# Patient Record
Sex: Male | Born: 1945 | Race: White | Hispanic: No | Marital: Married | Smoking: Former smoker
Health system: Southern US, Community
[De-identification: ages and names within clinical notes are randomized; demographics above are authoritative.]

## PROBLEM LIST (undated history)

## (undated) DIAGNOSIS — M1990 Unspecified osteoarthritis, unspecified site: Secondary | ICD-10-CM

## (undated) DIAGNOSIS — I10 Essential (primary) hypertension: Secondary | ICD-10-CM

## (undated) HISTORY — PX: HERNIA REPAIR: SHX51

## (undated) HISTORY — PX: SHOULDER SURGERY: SHX246

## (undated) HISTORY — PX: OTHER SURGICAL HISTORY: SHX169

---

## 2014-07-15 ENCOUNTER — Ambulatory Visit (HOSPITAL_COMMUNITY)
Admission: RE | Admit: 2014-07-15 | Discharge: 2014-07-15 | Disposition: A | Discharge status: Home / Self Care | Payer: Medicare Other | Source: Ambulatory Visit | Attending: Anesthesiology | Admitting: Anesthesiology

## 2014-07-15 ENCOUNTER — Encounter (HOSPITAL_COMMUNITY)
Admission: RE | Admit: 2014-07-15 | Discharge: 2014-07-15 | Disposition: A | Discharge status: Home / Self Care | Payer: Medicare Other | Source: Ambulatory Visit | Attending: Orthopedic Surgery | Admitting: Orthopedic Surgery

## 2014-07-15 ENCOUNTER — Encounter (HOSPITAL_COMMUNITY): Payer: Self-pay

## 2014-07-15 DIAGNOSIS — I10 Essential (primary) hypertension: Secondary | ICD-10-CM

## 2014-07-15 DIAGNOSIS — M25562 Pain in left knee: Secondary | ICD-10-CM | POA: Insufficient documentation

## 2014-07-15 DIAGNOSIS — Z01818 Encounter for other preprocedural examination: Secondary | ICD-10-CM | POA: Diagnosis present

## 2014-07-15 DIAGNOSIS — Z87891 Personal history of nicotine dependence: Secondary | ICD-10-CM | POA: Diagnosis not present

## 2014-07-15 HISTORY — DX: Essential (primary) hypertension: I10

## 2014-07-15 HISTORY — DX: Unspecified osteoarthritis, unspecified site: M19.90

## 2014-07-15 LAB — BASIC METABOLIC PANEL
Anion gap: 15 (ref 5–15)
BUN: 11 mg/dL (ref 6–23)
CHLORIDE: 102 meq/L (ref 96–112)
CO2: 25 mEq/L (ref 19–32)
Calcium: 9.7 mg/dL (ref 8.4–10.5)
Creatinine, Ser: 1 mg/dL (ref 0.50–1.35)
GFR calc Af Amer: 86 mL/min — ABNORMAL LOW (ref >90)
GFR calc non Af Amer: 74 mL/min — ABNORMAL LOW (ref >90)
GLUCOSE: 104 mg/dL — AB (ref 70–99)
POTASSIUM: 4.4 meq/L (ref 3.7–5.3)
Sodium: 142 mEq/L (ref 137–147)

## 2014-07-15 LAB — CBC
HEMATOCRIT: 45.5 % (ref 39.0–52.0)
HEMOGLOBIN: 15.3 g/dL (ref 13.0–17.0)
MCH: 31.9 pg (ref 26.0–34.0)
MCHC: 33.6 g/dL (ref 30.0–36.0)
MCV: 94.8 fL (ref 78.0–100.0)
Platelets: 302 10*3/uL (ref 150–400)
RBC: 4.8 MIL/uL (ref 4.22–5.81)
RDW: 13.5 % (ref 11.5–15.5)
WBC: 8.4 10*3/uL (ref 4.0–10.5)

## 2014-07-15 LAB — URINALYSIS, ROUTINE W REFLEX MICROSCOPIC
Bilirubin Urine: NEGATIVE
GLUCOSE, UA: NEGATIVE mg/dL
Hgb urine dipstick: NEGATIVE
Ketones, ur: NEGATIVE mg/dL
Leukocytes, UA: NEGATIVE
NITRITE: NEGATIVE
Protein, ur: NEGATIVE mg/dL
SPECIFIC GRAVITY, URINE: 1.009 (ref 1.005–1.030)
Urobilinogen, UA: 0.2 mg/dL (ref 0.0–1.0)
pH: 6.5 (ref 5.0–8.0)

## 2014-07-15 LAB — SURGICAL PCR SCREEN
MRSA, PCR: NEGATIVE
Staphylococcus aureus: POSITIVE — AB

## 2014-07-15 LAB — PROTIME-INR
INR: 1.05 (ref 0.00–1.49)
Prothrombin Time: 13.8 seconds (ref 11.6–15.2)

## 2014-07-15 LAB — APTT: aPTT: 33 seconds (ref 24–37)
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
## 2014-07-15 NOTE — Progress Notes (Signed)
Your patient has screened at an elevated risk for Obstructive Sleep Apnea using the Stop-Bang Tool during a pre-surgical vist. A score of 4 or greater is an elevated risk. Score of 5.  

## 2014-07-15 NOTE — Patient Instructions (Addendum)
20 Santiago BumpersWilliam A Schweiger  07/15/2014   Your procedure is scheduled on:     Tuesday, July 28, 2014  Report to [facility] Main Entrance and follow signs to  Short Stay Center arrive at 0700 AM.  Call this number if you have problems the morning of surgery [phone] or Presurgical Testing [phone].   Remember:  Do not eat food or drink liquids :After Midnight.  For Living Will and/or Health Care Power Attorney Forms: please provide copy for your medical record, may bring AM of surgery (forms should be already notarized-we do not provide this service).      Take these medicines the morning of surgery with A SIP OF WATER: Amlodipine                               You may not have any metal on your body including hair pins and piercings  Do not wear jewelry, lotions, powders, or deodorant.  Men may shave face and neck.               Do not bring valuables to the hospital. [facility] IS NOT RESPONSIBLE FOR VALUABLES.  Contacts, dentures or bridgework may not be worn into surgery.  Leave suitcase in the car. After surgery it may be brought to your room.  For patients admitted to the hospital, checkout time is 11:00 AM the day of discharge.    Special Instructions: review fact sheets for MRSA information, Blood Transfusion fact sheet, Incentive Spirometry.  Remember: Type/Screen "Blue armsbands"- may not be removed once applied(would result in being retested AM of surgery, if removed). ________________________________________________________________________  [facility] - Preparing for Surgery Before surgery, you can play an important role.  Because skin is not sterile, your skin needs to be as free of germs as possible.  You can reduce the number of germs on your skin by washing with CHG (chlorahexidine gluconate) soap before surgery.  CHG is an antiseptic cleaner which kills germs and bonds with the skin to continue killing germs even after washing. Please DO NOT use if you  have an allergy to CHG or antibacterial soaps.  If your skin becomes reddened/irritated stop using the CHG and inform your nurse when you arrive at Short Stay. Do not shave (including legs and underarms) for at least 48 hours prior to the first CHG shower.  You may shave your face/neck. Please follow these instructions carefully:  1.  Shower with CHG Soap the night before surgery and the  morning of Surgery.  2.  If you choose to wash your hair, wash your hair first as usual with your  normal  shampoo.  3.  After you shampoo, rinse your hair and body thoroughly to remove the  shampoo.                           4.  Use CHG as you would any other liquid soap.  You can apply chg directly  to the skin and wash                       Gently with a scrungie or clean washcloth.  5.  Apply the CHG Soap to your body ONLY FROM THE NECK DOWN.   Do not use on face/ open  Wound or open sores. Avoid contact with eyes, ears mouth and genitals (private parts).                       Wash face,  Genitals (private parts) with your normal soap.             6.  Wash thoroughly, paying special attention to the area where your surgery  will be performed.  7.  Thoroughly rinse your body with warm water from the neck down.  8.  DO NOT shower/wash with your normal soap after using and rinsing off  the CHG Soap.                9.  Pat yourself dry with a clean towel.            10.  Wear clean pajamas.            11.  Place clean sheets on your bed the night of your first shower and do not  sleep with pets. Day of Surgery : Do not apply any lotions/deodorants the morning of surgery.  Please wear clean clothes to the hospital/surgery center.  FAILURE TO FOLLOW THESE INSTRUCTIONS MAY RESULT IN THE CANCELLATION OF YOUR SURGERY PATIENT SIGNATURE_________________________________  NURSE  SIGNATURE__________________________________  ________________________________________________________________________   Adam Phenix  An incentive spirometer is a tool that can help keep your lungs clear and active. This tool measures how well you are filling your lungs with each breath. Taking long deep breaths may help reverse or decrease the chance of developing breathing (pulmonary) problems (especially infection) following:  A long period of time when you are unable to move or be active. BEFORE THE PROCEDURE   If the spirometer includes an indicator to show your best effort, your nurse or respiratory therapist will set it to a desired goal.  If possible, sit up straight or lean slightly forward. Try not to slouch.  Hold the incentive spirometer in an upright position. INSTRUCTIONS FOR USE   Sit on the edge of your bed if possible, or sit up as far as you can in bed or on a chair.  Hold the incentive spirometer in an upright position.  Breathe out normally.  Place the mouthpiece in your mouth and seal your lips tightly around it.  Breathe in slowly and as deeply as possible, raising the piston or the ball toward the top of the column.  Hold your breath for 3-5 seconds or for as long as possible. Allow the piston or ball to fall to the bottom of the column.  Remove the mouthpiece from your mouth and breathe out normally.  Rest for a few seconds and repeat Steps 1 through 7 at least 10 times every 1-2 hours when you are awake. Take your time and take a few normal breaths between deep breaths.  The spirometer may include an indicator to show your best effort. Use the indicator as a goal to work toward during each repetition.  After each set of 10 deep breaths, practice coughing to be sure your lungs are clear. If you have an incision (the cut made at the time of surgery), support your incision when coughing by placing a pillow or rolled up towels firmly against it. Once  you are able to get out of bed, walk around indoors and cough well. You may stop using the incentive spirometer when instructed by your caregiver.  RISKS AND COMPLICATIONS  Take your time so you do not  get dizzy or light-headed.  If you are in pain, you may need to take or ask for pain medication before doing incentive spirometry. It is harder to take a deep breath if you are having pain. AFTER USE  Rest and breathe slowly and easily.  It can be helpful to keep track of a log of your progress. Your caregiver can provide you with a simple table to help with this. If you are using the spirometer at home, follow these instructions: [place] IF:   You are having difficultly using the spirometer.  You have trouble using the spirometer as often as instructed.  Your pain medication is not giving enough relief while using the spirometer.  You develop fever of 100.5 F (38.1 C) or higher. SEEK IMMEDIATE MEDICAL CARE IF:   You cough up bloody sputum that had not been present before.  You develop fever of 102 F (38.9 C) or greater.  You develop worsening pain at or near the incision site. MAKE SURE YOU:   Understand these instructions.  Will watch your condition.  Will get help right away if you are not doing well or get worse. Document Released: 12/18/2006 Document Revised: 10/30/2011 Document Reviewed: 02/18/2007 ExitCare Patient Information 2014 ExitCare, [place].   ________________________________________________________________________  WHAT IS A BLOOD TRANSFUSION? Blood Transfusion Information  A transfusion is the replacement of blood or some of its parts. Blood is made up of multiple cells which provide different functions.  Red blood cells carry oxygen and are used for blood loss replacement.  White blood cells fight against infection.  Platelets control bleeding.  Plasma helps clot blood.  Other blood products are available for specialized needs, such as  hemophilia or other clotting disorders. BEFORE THE TRANSFUSION  Who gives blood for transfusions?   Healthy volunteers who are fully evaluated to make sure their blood is safe. This is blood bank blood. Transfusion therapy is the safest it has ever been in the practice of medicine. Before blood is taken from a donor, a complete history is taken to make sure that person has no history of diseases nor engages in risky social behavior (examples are intravenous drug use or sexual activity with multiple partners). The donor's travel history is screened to minimize risk of transmitting infections, such as malaria. The donated blood is tested for signs of infectious diseases, such as HIV and hepatitis. The blood is then tested to be sure it is compatible with you in order to minimize the chance of a transfusion reaction. If you or a relative donates blood, this is often done in anticipation of surgery and is not appropriate for emergency situations. It takes many days to process the donated blood. RISKS AND COMPLICATIONS Although transfusion therapy is very safe and saves many lives, the main dangers of transfusion include:   Getting an infectious disease.  Developing a transfusion reaction. This is an allergic reaction to something in the blood you were given. Every precaution is taken to prevent this. The decision to have a blood transfusion has been considered carefully by your caregiver before blood is given. Blood is not given unless the benefits outweigh the risks. AFTER THE TRANSFUSION  Right after receiving a blood transfusion, you will usually feel much better and more energetic. This is especially true if your red blood cells have gotten low (anemic). The transfusion raises the level of the red blood cells which carry oxygen, and this usually causes an energy increase.  The nurse administering the transfusion will  monitor you carefully for complications. HOME CARE INSTRUCTIONS  No special  instructions are needed after a transfusion. You may find your energy is better. Speak with your caregiver about any limitations on activity for underlying diseases you may have. SEEK MEDICAL CARE IF:   Your condition is not improving after your transfusion.  You develop redness or irritation at the intravenous (IV) site. SEEK IMMEDIATE MEDICAL CARE IF:  Any of the following symptoms occur over the next 12 hours:  Shaking chills.  You have a temperature by mouth above 102 F (38.9 C), not controlled by medicine.  Chest, back, or muscle pain.  People around you feel you are not acting correctly or are confused.  Shortness of breath or difficulty breathing.  Dizziness and fainting.  You get a rash or develop hives.  You have a decrease in urine output.  Your urine turns a dark color or changes to pink, red, or brown. Any of the following symptoms occur over the next 10 days:  You have a temperature by mouth above 102 F (38.9 C), not controlled by medicine.  Shortness of breath.  Weakness after normal activity.  The white part of the eye turns yellow (jaundice).  You have a decrease in the amount of urine or are urinating less often.  Your urine turns a dark color or changes to pink, red, or brown. Document Released: 08/04/2000 Document Revised: 10/30/2011 Document Reviewed: 03/23/2008 [facility] Patient Information 2014 [place], [place].  _______________________________________________________________________

## 2014-07-15 NOTE — Progress Notes (Signed)
Clearance note per chart from Dr Leary RocaMahoney 06/29/2014

## 2014-07-20 NOTE — Pre-Procedure Instructions (Signed)
07-20-14 1220 Pt. Confirmed received message for Positive Staph aureus- will pick up and use as directed.

## 2014-07-20 NOTE — Progress Notes (Addendum)
07-20-14 1110 AM Positive for Staph aureus by PCR -RX. Called to pharmacy [facility] [place] [phone].

## 2014-07-28 ENCOUNTER — Inpatient Hospital Stay (HOSPITAL_COMMUNITY): Payer: Medicare Other

## 2014-07-28 ENCOUNTER — Inpatient Hospital Stay (HOSPITAL_COMMUNITY): Payer: Medicare Other | Admitting: Anesthesiology

## 2014-07-28 ENCOUNTER — Encounter (HOSPITAL_COMMUNITY): Payer: Self-pay | Admitting: *Deleted

## 2014-07-28 ENCOUNTER — Encounter (HOSPITAL_COMMUNITY)
Admission: RE | Disposition: A | Discharge status: Home / Self Care | Payer: Self-pay | Source: Ambulatory Visit | Attending: Orthopedic Surgery

## 2014-07-28 ENCOUNTER — Inpatient Hospital Stay (HOSPITAL_COMMUNITY)
Admission: RE | Admit: 2014-07-28 | Discharge: 2014-07-29 | DRG: 470 | Disposition: A | Discharge status: Home Health | Payer: Medicare Other | Source: Ambulatory Visit | Attending: Orthopedic Surgery | Admitting: Orthopedic Surgery

## 2014-07-28 DIAGNOSIS — E669 Obesity, unspecified: Secondary | ICD-10-CM | POA: Diagnosis present

## 2014-07-28 DIAGNOSIS — M25552 Pain in left hip: Secondary | ICD-10-CM | POA: Diagnosis present

## 2014-07-28 DIAGNOSIS — I10 Essential (primary) hypertension: Secondary | ICD-10-CM | POA: Diagnosis present

## 2014-07-28 DIAGNOSIS — Z6836 Body mass index (BMI) 36.0-36.9, adult: Secondary | ICD-10-CM | POA: Diagnosis not present

## 2014-07-28 DIAGNOSIS — M1612 Unilateral primary osteoarthritis, left hip: Secondary | ICD-10-CM | POA: Diagnosis present

## 2014-07-28 DIAGNOSIS — Z96649 Presence of unspecified artificial hip joint: Secondary | ICD-10-CM

## 2014-07-28 HISTORY — PX: TOTAL HIP ARTHROPLASTY: SHX124

## 2014-07-28 LAB — TYPE AND SCREEN
ABO/RH(D): O POS
ANTIBODY SCREEN: NEGATIVE

## 2014-07-28 LAB — ABO/RH: ABO/RH(D): O POS

## 2014-07-28 SURGERY — ARTHROPLASTY, HIP, TOTAL, ANTERIOR APPROACH
Anesthesia: Spinal | Site: Hip | Laterality: Left

## 2014-07-28 MED ORDER — ASPIRIN EC 325 MG PO TBEC
325.0000 mg | DELAYED_RELEASE_TABLET | Freq: Two times a day (BID) | ORAL | Status: DC
  Administered 2014-07-29: 325 mg via ORAL
  Filled 2014-07-28 (×4): qty 1

## 2014-07-28 MED ORDER — MIDAZOLAM HCL 5 MG/5ML IJ SOLN
INTRAMUSCULAR | Status: DC | PRN
  Administered 2014-07-28: 2 mg via INTRAVENOUS

## 2014-07-28 MED ORDER — CELECOXIB 200 MG PO CAPS
200.0000 mg | ORAL_CAPSULE | Freq: Two times a day (BID) | ORAL | Status: DC
  Administered 2014-07-28 – 2014-07-29 (×2): 200 mg via ORAL
  Filled 2014-07-28 (×4): qty 1

## 2014-07-28 MED ORDER — HYDROMORPHONE HCL 1 MG/ML IJ SOLN: 0.2500 mg | INTRAMUSCULAR | Status: DC | PRN

## 2014-07-28 MED ORDER — POLYETHYLENE GLYCOL 3350 17 G PO PACK
17.0000 g | PACK | Freq: Two times a day (BID) | ORAL | Status: DC
  Administered 2014-07-28 – 2014-07-29 (×2): 17 g via ORAL
  Filled 2014-07-28: qty 1

## 2014-07-28 MED ORDER — CEFAZOLIN SODIUM-DEXTROSE 2-3 GM-% IV SOLR
INTRAVENOUS | Status: AC
  Filled 2014-07-28: qty 50

## 2014-07-28 MED ORDER — HYDROMORPHONE HCL 1 MG/ML IJ SOLN
0.5000 mg | INTRAMUSCULAR | Status: DC | PRN
  Administered 2014-07-28: 1 mg via INTRAVENOUS
  Filled 2014-07-28: qty 1

## 2014-07-28 MED ORDER — BUPIVACAINE IN DEXTROSE 0.75-8.25 % IT SOLN
INTRATHECAL | Status: DC | PRN
  Administered 2014-07-28: 2 mL via INTRATHECAL

## 2014-07-28 MED ORDER — CHLORHEXIDINE GLUCONATE 4 % EX LIQD
60.0000 mL | Freq: Once | CUTANEOUS | Status: DC
Start: 2014-07-28 — End: 2014-07-28

## 2014-07-28 MED ORDER — CEFAZOLIN SODIUM-DEXTROSE 2-3 GM-% IV SOLR
2.0000 g | INTRAVENOUS | Status: AC
  Administered 2014-07-28: 2 g via INTRAVENOUS

## 2014-07-28 MED ORDER — PROPOFOL INFUSION 10 MG/ML OPTIME
INTRAVENOUS | Status: DC | PRN
  Administered 2014-07-28: 50 ug/kg/min via INTRAVENOUS

## 2014-07-28 MED ORDER — FERROUS SULFATE 325 (65 FE) MG PO TABS
325.0000 mg | ORAL_TABLET | Freq: Three times a day (TID) | ORAL | Status: DC
  Administered 2014-07-28 – 2014-07-29 (×2): 325 mg via ORAL
  Filled 2014-07-28 (×6): qty 1

## 2014-07-28 MED ORDER — DOCUSATE SODIUM 100 MG PO CAPS
100.0000 mg | ORAL_CAPSULE | Freq: Two times a day (BID) | ORAL | Status: DC
  Administered 2014-07-28 – 2014-07-29 (×2): 100 mg via ORAL
  Filled 2014-07-28: qty 1

## 2014-07-28 MED ORDER — LACTATED RINGERS IV SOLN
INTRAVENOUS | Status: DC
  Administered 2014-07-28: 12:00:00 via INTRAVENOUS
  Administered 2014-07-28: 1000 mL via INTRAVENOUS

## 2014-07-28 MED ORDER — TRANEXAMIC ACID 100 MG/ML IV SOLN
1000.0000 mg | Freq: Once | INTRAVENOUS | Status: AC
  Administered 2014-07-28: 1000 mg via INTRAVENOUS
  Filled 2014-07-28: qty 10

## 2014-07-28 MED ORDER — METOCLOPRAMIDE HCL 5 MG/ML IJ SOLN: 5.0000 mg | Freq: Three times a day (TID) | INTRAMUSCULAR | Status: DC | PRN

## 2014-07-28 MED ORDER — DEXAMETHASONE SODIUM PHOSPHATE 10 MG/ML IJ SOLN
INTRAMUSCULAR | Status: AC
  Filled 2014-07-28: qty 1

## 2014-07-28 MED ORDER — METHOCARBAMOL 1000 MG/10ML IJ SOLN
500.0000 mg | Freq: Four times a day (QID) | INTRAVENOUS | Status: DC | PRN
  Administered 2014-07-28: 500 mg via INTRAVENOUS
  Filled 2014-07-28 (×2): qty 5

## 2014-07-28 MED ORDER — ONDANSETRON HCL 4 MG/2ML IJ SOLN: 4.0000 mg | Freq: Four times a day (QID) | INTRAMUSCULAR | Status: DC | PRN

## 2014-07-28 MED ORDER — BISACODYL 10 MG RE SUPP
10.0000 mg | Freq: Every day | RECTAL | Status: DC | PRN
  Filled 2014-07-28: qty 1

## 2014-07-28 MED ORDER — MIDAZOLAM HCL 2 MG/2ML IJ SOLN
INTRAMUSCULAR | Status: AC
  Filled 2014-07-28: qty 2

## 2014-07-28 MED ORDER — FENTANYL CITRATE 0.05 MG/ML IJ SOLN
INTRAMUSCULAR | Status: AC
  Filled 2014-07-28: qty 2

## 2014-07-28 MED ORDER — PROPOFOL 10 MG/ML IV BOLUS
INTRAVENOUS | Status: AC
  Filled 2014-07-28: qty 20

## 2014-07-28 MED ORDER — ONDANSETRON HCL 4 MG/2ML IJ SOLN
INTRAMUSCULAR | Status: AC
  Filled 2014-07-28: qty 2

## 2014-07-28 MED ORDER — MENTHOL 3 MG MT LOZG
1.0000 | LOZENGE | OROMUCOSAL | Status: DC | PRN
  Filled 2014-07-28: qty 9

## 2014-07-28 MED ORDER — DEXAMETHASONE SODIUM PHOSPHATE 10 MG/ML IJ SOLN: INTRAMUSCULAR | Status: DC | PRN

## 2014-07-28 MED ORDER — LIDOCAINE HCL (CARDIAC) 20 MG/ML IV SOLN
INTRAVENOUS | Status: AC
  Filled 2014-07-28: qty 5

## 2014-07-28 MED ORDER — EPHEDRINE SULFATE 50 MG/ML IJ SOLN
INTRAMUSCULAR | Status: AC
  Filled 2014-07-28: qty 1

## 2014-07-28 MED ORDER — MAGNESIUM CITRATE PO SOLN: 1.0000 | Freq: Once | ORAL | Status: AC | PRN

## 2014-07-28 MED ORDER — ATORVASTATIN CALCIUM 40 MG PO TABS
40.0000 mg | ORAL_TABLET | Freq: Every day | ORAL | Status: DC
Start: 2014-07-28 — End: 2014-07-29
  Administered 2014-07-28: 40 mg via ORAL
  Filled 2014-07-28 (×3): qty 1

## 2014-07-28 MED ORDER — SODIUM CHLORIDE 0.9 % IJ SOLN
INTRAMUSCULAR | Status: AC
  Filled 2014-07-28: qty 10

## 2014-07-28 MED ORDER — PHENYLEPHRINE HCL 10 MG/ML IJ SOLN
INTRAMUSCULAR | Status: DC | PRN
  Administered 2014-07-28: 40 ug via INTRAVENOUS
  Administered 2014-07-28 (×2): 80 ug via INTRAVENOUS
  Administered 2014-07-28 (×5): 40 ug via INTRAVENOUS

## 2014-07-28 MED ORDER — METOCLOPRAMIDE HCL 10 MG PO TABS: 5.0000 mg | ORAL_TABLET | Freq: Three times a day (TID) | ORAL | Status: DC | PRN

## 2014-07-28 MED ORDER — DEXAMETHASONE SODIUM PHOSPHATE 10 MG/ML IJ SOLN
10.0000 mg | Freq: Once | INTRAMUSCULAR | Status: AC
  Administered 2014-07-28: 10 mg via INTRAVENOUS

## 2014-07-28 MED ORDER — SODIUM CHLORIDE 0.9 % IV SOLN
100.0000 mL/h | INTRAVENOUS | Status: DC
  Administered 2014-07-28: 100 mL/h via INTRAVENOUS
  Filled 2014-07-28 (×3): qty 1000

## 2014-07-28 MED ORDER — DIPHENHYDRAMINE HCL 25 MG PO CAPS: 25.0000 mg | ORAL_CAPSULE | Freq: Four times a day (QID) | ORAL | Status: DC | PRN

## 2014-07-28 MED ORDER — ALUM & MAG HYDROXIDE-SIMETH 200-200-20 MG/5ML PO SUSP
30.0000 mL | ORAL | Status: DC | PRN
  Filled 2014-07-28: qty 30

## 2014-07-28 MED ORDER — ONDANSETRON HCL 4 MG PO TABS: 4.0000 mg | ORAL_TABLET | Freq: Four times a day (QID) | ORAL | Status: DC | PRN

## 2014-07-28 MED ORDER — METOPROLOL TARTRATE 25 MG PO TABS
25.0000 mg | ORAL_TABLET | Freq: Every day | ORAL | Status: DC
  Administered 2014-07-28: 25 mg via ORAL
  Filled 2014-07-28 (×2): qty 1

## 2014-07-28 MED ORDER — LIDOCAINE HCL (CARDIAC) 20 MG/ML IV SOLN
INTRAVENOUS | Status: DC | PRN
  Administered 2014-07-28: 50 mg via INTRAVENOUS

## 2014-07-28 MED ORDER — METHOCARBAMOL 500 MG PO TABS
500.0000 mg | ORAL_TABLET | Freq: Four times a day (QID) | ORAL | Status: DC | PRN
  Administered 2014-07-29: 500 mg via ORAL
  Filled 2014-07-28: qty 1

## 2014-07-28 MED ORDER — LACTATED RINGERS IV SOLN
INTRAVENOUS | Status: DC
Start: 2014-07-28 — End: 2014-07-28

## 2014-07-28 MED ORDER — FENTANYL CITRATE 0.05 MG/ML IJ SOLN
INTRAMUSCULAR | Status: DC | PRN
  Administered 2014-07-28 (×2): 25 ug via INTRAVENOUS

## 2014-07-28 MED ORDER — CEFAZOLIN SODIUM-DEXTROSE 2-3 GM-% IV SOLR
2.0000 g | Freq: Four times a day (QID) | INTRAVENOUS | Status: AC
  Administered 2014-07-28 (×2): 2 g via INTRAVENOUS
  Filled 2014-07-28 (×2): qty 50

## 2014-07-28 MED ORDER — HYDROCODONE-ACETAMINOPHEN 7.5-325 MG PO TABS
1.0000 | ORAL_TABLET | ORAL | Status: DC
  Administered 2014-07-28 – 2014-07-29 (×6): 2 via ORAL
  Filled 2014-07-28 (×6): qty 2

## 2014-07-28 MED ORDER — PROPOFOL 10 MG/ML IV BOLUS
INTRAVENOUS | Status: DC | PRN
  Administered 2014-07-28: 50 mg via INTRAVENOUS

## 2014-07-28 MED ORDER — ONDANSETRON HCL 4 MG/2ML IJ SOLN
INTRAMUSCULAR | Status: DC | PRN
  Administered 2014-07-28: 4 mg via INTRAVENOUS

## 2014-07-28 MED ORDER — EPHEDRINE SULFATE 50 MG/ML IJ SOLN
INTRAMUSCULAR | Status: DC | PRN
  Administered 2014-07-28: 10 mg via INTRAVENOUS
  Administered 2014-07-28: 20 mg via INTRAVENOUS
  Administered 2014-07-28 (×2): 10 mg via INTRAVENOUS

## 2014-07-28 MED ORDER — DEXAMETHASONE SODIUM PHOSPHATE 10 MG/ML IJ SOLN
10.0000 mg | Freq: Once | INTRAMUSCULAR | Status: AC
  Administered 2014-07-29: 10 mg via INTRAVENOUS
  Filled 2014-07-28: qty 1

## 2014-07-28 MED ORDER — PHENYLEPHRINE 40 MCG/ML (10ML) SYRINGE FOR IV PUSH (FOR BLOOD PRESSURE SUPPORT)
PREFILLED_SYRINGE | INTRAVENOUS | Status: AC
  Filled 2014-07-28: qty 10

## 2014-07-28 MED ORDER — PHENOL 1.4 % MT LIQD
1.0000 | OROMUCOSAL | Status: DC | PRN
  Filled 2014-07-28: qty 177

## 2014-07-28 MED ORDER — SODIUM CHLORIDE 0.9 % IR SOLN
Status: DC | PRN
  Administered 2014-07-28: 1000 mL

## 2014-07-28 MED ORDER — AMLODIPINE BESYLATE 10 MG PO TABS
10.0000 mg | ORAL_TABLET | Freq: Every morning | ORAL | Status: DC
  Filled 2014-07-28 (×2): qty 1

## 2014-07-28 SURGICAL SUPPLY — 38 items
BAG ZIPLOCK 12X15 (MISCELLANEOUS) IMPLANT
CAPT HIP TOTAL 2 ×3 IMPLANT
COVER PERINEAL POST (MISCELLANEOUS) ×3 IMPLANT
DERMABOND ADVANCED (GAUZE/BANDAGES/DRESSINGS) ×2
DERMABOND ADVANCED .7 DNX12 (GAUZE/BANDAGES/DRESSINGS) ×1 IMPLANT
DRAPE C-ARM 42X120 X-RAY (DRAPES) ×3 IMPLANT
DRAPE STERI IOBAN 125X83 (DRAPES) ×3 IMPLANT
DRAPE U-SHAPE 47X51 STRL (DRAPES) ×9 IMPLANT
DRSG AQUACEL AG ADV 3.5X10 (GAUZE/BANDAGES/DRESSINGS) ×3 IMPLANT
DURAPREP 26ML APPLICATOR (WOUND CARE) ×3 IMPLANT
ELECT BLADE TIP CTD 4 INCH (ELECTRODE) ×3 IMPLANT
ELECT PENCIL ROCKER SW 15FT (MISCELLANEOUS) IMPLANT
ELECT REM PT RETURN 15FT ADLT (MISCELLANEOUS) IMPLANT
ELECT REM PT RETURN 9FT ADLT (ELECTROSURGICAL) ×3
ELECTRODE REM PT RTRN 9FT ADLT (ELECTROSURGICAL) ×1 IMPLANT
FACESHIELD WRAPAROUND (MASK) ×12 IMPLANT
GLOVE BIOGEL PI IND STRL 7.5 (GLOVE) ×1 IMPLANT
GLOVE BIOGEL PI IND STRL 8.5 (GLOVE) ×1 IMPLANT
GLOVE BIOGEL PI INDICATOR 7.5 (GLOVE) ×2
GLOVE BIOGEL PI INDICATOR 8.5 (GLOVE) ×2
GLOVE ECLIPSE 8.0 STRL XLNG CF (GLOVE) ×6 IMPLANT
GLOVE ORTHO TXT STRL SZ7.5 (GLOVE) ×3 IMPLANT
GOWN SPEC L3 XXLG W/TWL (GOWN DISPOSABLE) ×3 IMPLANT
GOWN STRL REUS W/TWL LRG LVL3 (GOWN DISPOSABLE) ×3 IMPLANT
HOLDER FOLEY CATH W/STRAP (MISCELLANEOUS) ×3 IMPLANT
KIT BASIN OR (CUSTOM PROCEDURE TRAY) ×3 IMPLANT
LIQUID BAND (GAUZE/BANDAGES/DRESSINGS) ×3 IMPLANT
PACK TOTAL JOINT (CUSTOM PROCEDURE TRAY) ×3 IMPLANT
SAW OSC TIP CART 19.5X105X1.3 (SAW) ×3 IMPLANT
SUT MNCRL AB 4-0 PS2 18 (SUTURE) ×3 IMPLANT
SUT VIC AB 1 CT1 36 (SUTURE) ×9 IMPLANT
SUT VIC AB 2-0 CT1 27 (SUTURE) ×4
SUT VIC AB 2-0 CT1 TAPERPNT 27 (SUTURE) ×2 IMPLANT
SUT VLOC 180 0 24IN GS25 (SUTURE) ×3 IMPLANT
TOWEL OR 17X26 10 PK STRL BLUE (TOWEL DISPOSABLE) ×3 IMPLANT
TOWEL OR NON WOVEN STRL DISP B (DISPOSABLE) IMPLANT
TRAY FOLEY CATH 16FRSI W/METER (SET/KITS/TRAYS/PACK) ×3 IMPLANT
WATER STERILE IRR 1500ML POUR (IV SOLUTION) ×3 IMPLANT

## 2014-07-28 NOTE — Anesthesia Preprocedure Evaluation (Addendum)
Anesthesia Evaluation  Patient identified by MRN, date of birth, ID band Patient awake    Reviewed: Allergy & Precautions, H&P , NPO status , Patient's Chart, lab work & pertinent test results, reviewed documented beta blocker date and time   Airway Mallampati: II  TM Distance: >3 FB Neck ROM: full    Dental  (+) Edentulous Lower, Poor Dentition, Implants, Dental Advisory Given Upper front teeth are implants but they are worn down to nubs.:   Pulmonary neg pulmonary ROS, former smoker,  breath sounds clear to auscultation  Pulmonary exam normal       Cardiovascular Exercise Tolerance: Good hypertension, Pt. on medications and Pt. on home beta blockers Rhythm:regular Rate:Normal     Neuro/Psych negative neurological ROS  negative psych ROS   GI/Hepatic negative GI ROS, Neg liver ROS,   Endo/Other  negative endocrine ROS  Renal/GU negative Renal ROS  negative genitourinary   Musculoskeletal   Abdominal   Peds  Hematology negative hematology ROS (+)   Anesthesia Other Findings   Reproductive/Obstetrics negative OB ROS                            Anesthesia Physical Anesthesia Plan  ASA: II  Anesthesia Plan: Spinal   Post-op Pain Management:    Induction:   Airway Management Planned: Simple Face Mask  Additional Equipment:   Intra-op Plan:   Post-operative Plan:   Informed Consent: I have reviewed the patients History and Physical, chart, labs and discussed the procedure including the risks, benefits and alternatives for the proposed anesthesia with the patient or authorized representative who has indicated his/her understanding and acceptance.   Dental Advisory Given  Plan Discussed with: CRNA and Surgeon  Anesthesia Plan Comments:        Anesthesia Quick Evaluation

## 2014-07-28 NOTE — Transfer of Care (Signed)
Immediate Anesthesia Transfer of Care Note  Patient: Richard Curtis  Procedure(s) Performed: Procedure(s) (LRB): LEFT TOTAL HIP ARTHROPLASTY ANTERIOR APPROACH (Left)  Patient Location: PACU  Anesthesia Type: Spinal  Level of Consciousness: sedated, patient cooperative and responds to stimulation  Airway & Oxygen Therapy: Patient Spontanous Breathing and Patient connected to face mask oxgen  Post-op Assessment: Report given to PACU RN and Post -op Vital signs reviewed and stable  Post vital signs: Reviewed and stable  Complications: No apparent anesthesia complications

## 2014-07-28 NOTE — Evaluation (Signed)
Physical Therapy Evaluation Patient Details Name: Richard Curtis MRN: [id number] DOB: 05/20/1946 Today's Date: 07/28/2014   History of Present Illness  [name]  Clinical Impression  Patient elated that he is walking better than this AM and pain is minimal. Patient will benefit from PT to address problems listed in note below.    Follow Up Recommendations Home health PT;Supervision/Assistance - 24 hour    Equipment Recommendations  None recommended by PT    Recommendations for Other Services       Precautions / Restrictions Precautions Precautions: Fall      Mobility  Bed Mobility Overal bed mobility: Needs Assistance Bed Mobility: Supine to Sit     Supine to sit: Min assist     General bed mobility comments: cues for technique, extra time  Transfers Overall transfer level: Needs assistance Equipment used: Rolling walker (2 wheeled) Transfers: Sit to/from Stand Sit to Stand: Min assist         General transfer comment: cues for hand and  L leg position  Ambulation/Gait Ambulation/Gait assistance: Min assist Ambulation Distance (Feet): 200 Feet Assistive device: Rolling walker (2 wheeled) Gait Pattern/deviations: Step-to pattern;Antalgic     General Gait Details: cues for sequence  Stairs            Wheelchair Mobility    Modified Rankin (Stroke Patients Only)       Balance                                             Pertinent Vitals/Pain Pain Assessment: 0-10 Pain Score: 2  Pain Location: L  thigh, hip Pain Descriptors / Indicators: Burning;Sharp    Home Living Family/patient expects to be discharged to:: Private residence Living Arrangements: Spouse/significant other Available Help at Discharge: Family Type of Home: House Home Access: Stairs to enter       Home Equipment: Environmental consultantWalker - 2 wheels;Cane - single point      Prior Function Level of Independence: Independent               Hand Dominance         Extremity/Trunk Assessment               Lower Extremity Assessment: LLE deficits/detail   LLE Deficits / Details: advances L leg  Cervical / Trunk Assessment: Normal  Communication   Communication: No difficulties  Cognition Arousal/Alertness: Awake/alert Behavior During Therapy: WFL for tasks assessed/performed Overall Cognitive Status: Within Functional Limits for tasks assessed                      General Comments      Exercises        Assessment/Plan    PT Assessment Patient needs continued PT services  PT Diagnosis Difficulty walking;Acute pain   PT Problem List Decreased strength;Decreased range of motion;Decreased activity tolerance;Decreased mobility;Decreased knowledge of precautions;Decreased safety awareness;Pain  PT Treatment Interventions DME instruction;Gait training;Stair training;Functional mobility training;Therapeutic activities;Therapeutic exercise;Patient/family education   PT Goals (Current goals can be found in the Care Plan section) Acute Rehab PT Goals Patient Stated Goal: to walk without pain PT Goal Formulation: With patient Time For Goal Achievement: 07/30/14 Potential to Achieve Goals: Good    Frequency 7X/week   Barriers to discharge        Co-evaluation  End of Session   Activity Tolerance: Patient tolerated treatment well Patient left: in chair;with call bell/phone within reach Nurse Communication: Mobility status         Time: [id number] PT Time Calculation (min) (ACUTE ONLY): 23 min   Charges:   PT Evaluation $Initial PT Evaluation Tier I: 1 Procedure PT Treatments $Gait Training: 23-37 mins   PT G Codes:          Rada HayHill, Richard Curtis 07/28/2014, 6:19 PM

## 2014-07-28 NOTE — Plan of Care (Signed)
Problem: Phase II Progression Outcomes Goal: Ambulates Outcome: Completed/Met Date Met:  07/28/14 Goal: Tolerating diet Outcome: Completed/Met Date Met:  07/28/14  Problem: Phase III Progression Outcomes Goal: Pain controlled on oral analgesia Outcome: Completed/Met Date Met:  07/28/14

## 2014-07-28 NOTE — Anesthesia Postprocedure Evaluation (Signed)
  Anesthesia Post-op Note  Patient: Richard Curtis  Procedure(s) Performed: Procedure(s) (LRB): LEFT TOTAL HIP ARTHROPLASTY ANTERIOR APPROACH (Left)  Patient Location: PACU  Anesthesia Type: Spinal  Level of Consciousness: awake and alert   Airway and Oxygen Therapy: Patient Spontanous Breathing  Post-op Pain: mild  Post-op Assessment: Post-op Vital signs reviewed, Patient's Cardiovascular Status Stable, Respiratory Function Stable, Patent Airway and No signs of Nausea or vomiting  Last Vitals:  Filed Vitals:   07/28/14 1315  BP: 113/69  Pulse: 60  Temp: 36.6 C  Resp: 12    Post-op Vital Signs: stable   Complications: No apparent anesthesia complications

## 2014-07-28 NOTE — Interval H&P Note (Signed)
History and Physical Interval Note:  07/28/2014 7:03 AM  Richard Curtis  has presented today for surgery, with the diagnosis of left hip ostoearthritis  The various methods of treatment have been discussed with the patient and family. After consideration of risks, benefits and other options for treatment, the patient has consented to  Procedure(s): LEFT TOTAL HIP ARTHROPLASTY ANTERIOR APPROACH (Left) as a surgical intervention .  The patient's history has been reviewed, patient examined, no change in status, stable for surgery.  I have reviewed the patient's chart and labs.  Questions were answered to the patient's satisfaction.     Shelda PalLIN,Aleana Fifita D

## 2014-07-28 NOTE — Op Note (Signed)
NAME:  Richard Curtis                ACCOUNT NO.: [id number]      MEDICAL RECORD NO.: [id number]      FACILITY:  [facility]      PHYSICIAN:  Durene Romans D  DATE OF BIRTH:  30-Jan-1946     DATE OF PROCEDURE:  07/28/2014                                 OPERATIVE REPORT         PREOPERATIVE DIAGNOSIS: Left  hip osteoarthritis.      POSTOPERATIVE DIAGNOSIS:  Left hip osteoarthritis.      PROCEDURE:  Left total hip replacement through an anterior approach   utilizing DePuy THR system, component size 54mm pinnacle cup, a size 36+4 neutral   Altrex liner, a size 7 Hi Tri Lock stem with a 36+1.5 delta ceramic   ball.      SURGEON:  Madlyn Frankel. Charlann Boxer, M.D.      ASSISTANT:  Lanney Gins, PA-C      ANESTHESIA:  Spinal.      SPECIMENS:  None.      COMPLICATIONS:  None.      BLOOD LOSS:  300 cc     DRAINS:  None.      INDICATION OF THE PROCEDURE:  Richard Curtis is a 68 y.o. male who had   presented to office for evaluation of left hip pain.  Radiographs revealed   progressive degenerative changes with bone-on-bone   articulation to the  hip joint.  The patient had painful limited range of   motion significantly affecting their overall quality of life.  The patient was failing to    respond to conservative measures, and at this point was ready   to proceed with more definitive measures.  The patient has noted progressive   degenerative changes in his hip, progressive problems and dysfunction   with regarding the hip prior to surgery.  Consent was obtained for   benefit of pain relief.  Specific risk of infection, DVT, component   failure, dislocation, need for revision surgery, as well discussion of   the anterior versus posterior approach were reviewed.  Consent was   obtained for benefit of anterior pain relief through an anterior   approach.      PROCEDURE IN DETAIL:  The patient was brought to operative theater.   Once adequate anesthesia, preoperative  antibiotics, 2gm of Ancef administered.   The patient was positioned supine on the OSI Hanna table.  Once adequate   padding of boney process was carried out, we had predraped out the hip, and  used fluoroscopy to confirm orientation of the pelvis and position.      The left hip was then prepped and draped from proximal iliac crest to   mid thigh with shower curtain technique.      Time-out was performed identifying the patient, planned procedure, and   extremity.     An incision was then made 2 cm distal and lateral to the   anterior superior iliac spine extending over the orientation of the   tensor fascia lata muscle and sharp dissection was carried down to the   fascia of the muscle and protractor placed in the soft tissues.      The fascia was then incised.  The muscle belly was identified and  swept   laterally and retractor placed along the superior neck.  Following   cauterization of the circumflex vessels and removing some pericapsular   fat, a second cobra retractor was placed on the inferior neck.  A third   retractor was placed on the anterior acetabulum after elevating the   anterior rectus.  A L-capsulotomy was along the line of the   superior neck to the trochanteric fossa, then extended proximally and   distally.  Tag sutures were placed and the retractors were then placed   intracapsular.  We then identified the trochanteric fossa and   orientation of my neck cut, confirmed this radiographically   and then made a neck osteotomy with the femur on traction.  The femoral   head was removed without difficulty or complication.  Traction was let   off and retractors were placed posterior and anterior around the   acetabulum.      The labrum and foveal tissue were debrided.  I began reaming with a 47mm   reamer and reamed up to 53mm reamer with good bony bed preparation and a 54mm   cup was chosen.  The final 54mm Pinnacle cup was then impacted under fluoroscopy  to confirm  the depth of penetration and orientation with respect to   abduction.  A screw was placed followed by the hole eliminator.  The final   36+4 neutral Altrex liner was impacted with good visualized rim fit.  The cup was positioned anatomically within the acetabular portion of the pelvis.      At this point, the femur was rolled at 80 degrees.  Further capsule was   released off the inferior aspect of the femoral neck.  I then   released the superior capsule proximally.  The hook was placed laterally   along the femur and elevated manually and held in position with the bed   hook.  The leg was then extended and adducted with the leg rolled to 100   degrees of external rotation.  Once the proximal femur was fully   exposed, I used a box osteotome to set orientation.  I then began   broaching with the starting chili pepper broach and passed this by hand and then broached up to 7.  With the 7 broach in place I chose a high offset neck and did a trial reduction.  The offset was appropriate, leg lengths   appeared to be equal, confirmed radiographically.   Given these findings, I went ahead and dislocated the hip, repositioned all   retractors and positioned the right hip in the extended and abducted position.  The final 7 Hi Tri Lock stem was   chosen and it was impacted down to the level of neck cut.  Based on this   and the trial reduction, a 36+1.5 delta ceramic ball was chosen and   impacted onto a clean and dry trunnion, and the hip was reduced.  The   hip had been irrigated throughout the case again at this point.  I did   reapproximate the superior capsular leaflet to the anterior leaflet   using #1 Vicryl.  The fascia of the   tensor fascia lata muscle was then reapproximated using #1 Vicryl.  The   remaining wound was closed with 2-0 Vicryl and running 4-0 Monocryl.   The hip was cleaned, dried, and dressed sterilely using Dermabond and   Aquacel dressing.  He was then brought   to  recovery room in  stable condition tolerating the procedure well.    Lanney Gins, PA-C was present for the entirety of the case involved from   preoperative positioning, perioperative retractor management, general   facilitation of the case, as well as primary wound closure as assistant.            Madlyn Frankel Charlann Boxer, M.D.        07/28/2014 11:34 AM

## 2014-07-28 NOTE — Plan of Care (Signed)
Problem: Phase I Progression Outcomes Goal: CMS/Neurovascular status WDL Outcome: Completed/Met Date Met:  07/28/14 Goal: Pain controlled with appropriate interventions Outcome: Completed/Met Date Met:  07/28/14 Goal: Dangle or out of bed evening of surgery Outcome: Completed/Met Date Met:  07/28/14 Goal: Initial discharge plan identified Outcome: Completed/Met Date Met:  07/28/14 Goal: Hemodynamically stable Outcome: Completed/Met Date Met:  07/28/14 Goal: Other Phase I Outcomes/Goals Outcome: Not Applicable Date Met:  59/97/87

## 2014-07-29 ENCOUNTER — Encounter (HOSPITAL_COMMUNITY): Payer: Self-pay | Admitting: Orthopedic Surgery

## 2014-07-29 LAB — CBC
HCT: 37.1 % — ABNORMAL LOW (ref 39.0–52.0)
Hemoglobin: 12.4 g/dL — ABNORMAL LOW (ref 13.0–17.0)
MCH: 31.9 pg (ref 26.0–34.0)
MCHC: 33.4 g/dL (ref 30.0–36.0)
MCV: 95.4 fL (ref 78.0–100.0)
PLATELETS: 245 10*3/uL (ref 150–400)
RBC: 3.89 MIL/uL — ABNORMAL LOW (ref 4.22–5.81)
RDW: 13.3 % (ref 11.5–15.5)
WBC: 11.7 10*3/uL — ABNORMAL HIGH (ref 4.0–10.5)

## 2014-07-29 LAB — BASIC METABOLIC PANEL
ANION GAP: 12 (ref 5–15)
BUN: 14 mg/dL (ref 6–23)
CO2: 23 meq/L (ref 19–32)
CREATININE: 1.02 mg/dL (ref 0.50–1.35)
Calcium: 8.9 mg/dL (ref 8.4–10.5)
Chloride: 100 mEq/L (ref 96–112)
GFR calc non Af Amer: 72 mL/min — ABNORMAL LOW (ref >90)
GFR, EST AFRICAN AMERICAN: 84 mL/min — AB (ref >90)
Glucose, Bld: 155 mg/dL — ABNORMAL HIGH (ref 70–99)
Potassium: 4.7 mEq/L (ref 3.7–5.3)
Sodium: 135 mEq/L — ABNORMAL LOW (ref 137–147)

## 2014-07-29 MED ORDER — ASPIRIN 325 MG PO TBEC: 325.0000 mg | DELAYED_RELEASE_TABLET | Freq: Two times a day (BID) | ORAL | Status: AC

## 2014-07-29 MED ORDER — DSS 100 MG PO CAPS: 100.0000 mg | ORAL_CAPSULE | Freq: Two times a day (BID) | ORAL | Status: DC

## 2014-07-29 MED ORDER — POLYETHYLENE GLYCOL 3350 17 G PO PACK: 17.0000 g | PACK | Freq: Two times a day (BID) | ORAL | Status: DC

## 2014-07-29 MED ORDER — HYDROCODONE-ACETAMINOPHEN 7.5-325 MG PO TABS: 1.0000 | ORAL_TABLET | ORAL | Status: DC | PRN

## 2014-07-29 MED ORDER — TIZANIDINE HCL 4 MG PO TABS: 4.0000 mg | ORAL_TABLET | Freq: Four times a day (QID) | ORAL | Status: DC | PRN

## 2014-07-29 MED ORDER — FERROUS SULFATE 325 (65 FE) MG PO TABS: 325.0000 mg | ORAL_TABLET | Freq: Three times a day (TID) | ORAL | Status: DC

## 2014-07-29 NOTE — Progress Notes (Signed)
Physical Therapy Treatment Patient Details Name: Richard Curtis MRN: [id number] DOB: 10/02/1945 Today's Date: 07/29/2014    History of Present Illness Richard Curtis    PT Comments    Patient ready for DC  Follow Up Recommendations  Home health PT;Supervision/Assistance - 24 hour     Equipment Recommendations  None recommended by PT    Recommendations for Other Services       Precautions / Restrictions Precautions Precautions: Fall Restrictions Weight Bearing Restrictions: No    Mobility  Bed Mobility   Bed Mobility: Supine to Sit     Supine to sit: Supervision     General bed mobility comments: cues for technique  Transfers Overall transfer level: Needs assistance Equipment used: Rolling walker (2 wheeled) Transfers: Sit to/from Stand Sit to Stand: Supervision         General transfer comment: cues for hand and  Richard leg position  Ambulation/Gait Ambulation/Gait assistance: Supervision Ambulation Distance (Feet): 400 Feet Assistive device: Rolling walker (2 wheeled) Gait Pattern/deviations: Step-to pattern     General Gait Details: cues for sequence and posture   Stairs Stairs: Yes Stairs assistance: Supervision Stair Management: No rails;Step to pattern;Forwards Number of Stairs: 1 General stair comments: pt able to step up.  Wheelchair Mobility    Modified Rankin (Stroke Patients Only)       Balance                                    Cognition Arousal/Alertness: Awake/alert Behavior During Therapy: WFL for tasks assessed/performed Overall Cognitive Status: Within Functional Limits for tasks assessed                      Exercises Total Joint Exercises Ankle Circles/Pumps: AROM;Left;10 reps;Supine Quad Sets: AROM;Left;10 reps;Supine Short Arc Quad: AROM;Left;10 reps;Supine Heel Slides: AROM;Left;10 reps;Supine Hip ABduction/ADduction: AROM;Left;10 reps;Supine Straight Leg Raises: AROM;Left;10 reps;Supine Long  Arc Quad: AROM;Left;10 reps;Seated    General Comments        Pertinent Vitals/Pain Pain Score: 1  Pain Location: Richard thigh Pain Descriptors / Indicators: Aching;Sore Pain Intervention(s): Premedicated before session;Ice applied;Monitored during session    Home Living Family/patient expects to be discharged to:: Private residence Living Arrangements: Spouse/significant other Available Help at Discharge: Family Type of Home: House Home Access: Stairs to enter   Home Layout: One level Home Equipment: Environmental consultantWalker - 2 wheels;Cane - single point      Prior Function Level of Independence: Independent          PT Goals (current goals can now be found in the care plan section) Progress towards PT goals: Progressing toward goals    Frequency  7X/week    PT Plan Current plan remains appropriate    Co-evaluation             End of Session   Activity Tolerance: Patient tolerated treatment well Patient left: in chair;with family/visitor present     Time: 1210-1222 PT Time Calculation (min) (ACUTE ONLY): 12 min  Charges:  $Gait Training: 8-22 mins                    G Codes:      Richard Curtis, Richard Curtis 07/29/2014, 12:54 PM

## 2014-07-29 NOTE — Evaluation (Signed)
Occupational Therapy Evaluation Patient Details Name: Richard PewWilliam A Curtis MRN: [id number] DOB: 10/24/1945 Today's Date: 07/29/2014    History of Present Illness Richard Curtis   Clinical Impression    OT education complete s/p hip surgery    Follow Up Recommendations  No OT follow up    Equipment Recommendations  None recommended by OT       Precautions / Restrictions Precautions Precautions: Fall Restrictions Weight Bearing Restrictions: No      Mobility Bed Mobility               General bed mobility comments: pt in chair  Transfers Overall transfer level: Needs assistance Equipment used: Rolling walker (2 wheeled) Transfers: Sit to/from Stand Sit to Stand: Supervision                   ADL Overall ADL's : Needs assistance/impaired                         Toilet Transfer: Supervision/safety;RW   Toileting- ArchitectClothing Manipulation and Hygiene: Supervision/safety;Sit to/from stand   Tub/ Shower Transfer: Walk-in shower;Supervision/safety     General ADL Comments: Pt overall S with ADL activity.  Wife will as needed. Pt doing very well !                     Extremity/Trunk Assessment Upper Extremity Assessment Upper Extremity Assessment: Overall WFL for tasks assessed           Communication Communication Communication: No difficulties   Cognition Arousal/Alertness: Awake/alert Behavior During Therapy: WFL for tasks assessed/performed Overall Cognitive Status: Within Functional Limits for tasks assessed                                Home Living Family/patient expects to be discharged to:: Private residence Living Arrangements: Spouse/significant other Available Help at Discharge: Family Type of Home: House Home Access: Stairs to enter     Home Layout: One level     Bathroom Shower/Tub: Chief Strategy OfficerTub/shower unit   Bathroom Toilet: Standard     Home Equipment: Environmental consultantWalker - 2 wheels;Cane - single point           Prior Functioning/Environment Level of Independence: Independent             OT Diagnosis: Generalized weakness                          End of Session Equipment Utilized During Treatment: Rolling walker  Activity Tolerance: Patient tolerated treatment well Patient left: in chair;with family/visitor present   Time: 9629-52841106-1129 OT Time Calculation (min): 23 min Charges:  OT General Charges $OT Visit: 1 Procedure OT Evaluation $Initial OT Evaluation Tier I: 1 Procedure OT Treatments $Self Care/Home Management : 8-22 mins G-Codes:    Richard CrowEDDING, Richard Curtis D 07/29/2014, 12:22 PM

## 2014-07-29 NOTE — Care Management Note (Signed)
    Page 1 of 2   07/29/2014     1:24:17 PM CARE MANAGEMENT NOTE 07/29/2014  Patient:  Richard Curtis, Richard Curtis   Account Number:  [id number]  Date Initiated:  07/29/2014  Documentation initiated by:  [facility]  Subjective/Objective Assessment:   adm: LEFT TOTAL HIP ARTHROPLASTY ANTERIOR APPROACH (Left)     Action/Plan:   disharge planning   Anticipated DC Date:  07/29/2014   Anticipated DC Plan:  [place]  CM consult      [facility] Choice  HOME HEALTH   Choice offered to / List presented to:  C-1 Patient   DME arranged  NA      DME agency  NA     [place] arranged  HH-2 PT      [place] agency  OTHER - SEE NOTE   Status of service:  Completed, signed off Medicare Important Message given?   (If response is "NO", the following Medicare IM given date fields will be blank) Date Medicare IM given:   Medicare IM given by:   Date Additional Medicare IM given:   Additional Medicare IM given by:    Discharge Disposition:  [name]  Per UR Regulation:  Reviewed for med. necessity/level of care/duration of stay  If discussed at [place] of Stay Meetings, dates discussed:    Comments:  07/29/14 07:15 Cm met withpt in room to offer choice but bc of short staffing in [place] was the only agency who would accept referral; pt states this is fine.  Pt does NOT need any DME as he has both rolling walker and elevated toilet with hand supports.  Address and contact information verified with pt.  Referral was called to [facility] [phone] and at her request, I faxed facesheet, order, F2F, H&P, and Last progress note to [phone].  No other CM needs were communicated.  Mariane Masters, BSn, CM [phone].

## 2014-07-29 NOTE — Progress Notes (Signed)
     Subjective: 1 Day Post-Op Procedure(s) (LRB): LEFT TOTAL HIP ARTHROPLASTY ANTERIOR APPROACH (Left)   Seen by Dr. Charlann Boxerlin. Patient reports pain as mild, pain controlled. No events throughout the night. Ready to be discharged home if they do well with PT and pain stays controlled.  Objective:   VITALS:   Filed Vitals:   07/29/14  BP: 102/65  Pulse: 53  Temp: 98.6 F (37 C)   Resp: 16    Dorsiflexion/Plantar flexion intact Incision: dressing C/D/I No cellulitis present Compartment soft  LABS  Recent Labs  07/29/14 0520  HGB 12.4*  HCT 37.1*  WBC 11.7*  PLT 245     Recent Labs  07/29/14 0520  NA 135*  K 4.7  BUN 14  CREATININE 1.02  GLUCOSE 155*     Assessment/Plan: 1 Day Post-Op Procedure(s) (LRB): LEFT TOTAL HIP ARTHROPLASTY ANTERIOR APPROACH (Left) Foley cath d/c'ed Advance diet Up with therapy D/C IV fluids Discharge home with home health [facility] Follow up with OLIN,Velora Horstman D in 2 weeks.  Contact information:  [facility] [address], [address] [place] [id number] [phone]    Obese (BMI 30-39.9) Estimated body mass index is 36.8 kg/(m^2) as calculated from the following:   Height as of this encounter: 5\' 8"  (1.727 m).   Weight as of this encounter: 109.77 kg (242 lb). Patient also counseled that weight may inhibit the healing process Patient counseled that losing weight will help with future health issues      Anastasio AuerbachMatthew S. Loney Peto   PAC  07/29/2014, 9:35 AM

## 2014-07-29 NOTE — Progress Notes (Signed)
Physical Therapy Treatment Patient Details Name: Merrily PewWilliam A Blow MRN: [id number] DOB: 02/16/1946 Today's Date: 07/29/2014    History of Present Illness [name]    PT Comments    Patient progressing very well. Practice 1 step, DC.  Follow Up Recommendations  Home health PT;Supervision/Assistance - 24 hour     Equipment Recommendations  None recommended by PT    Recommendations for Other Services       Precautions / Restrictions Precautions Precautions: Fall Restrictions Weight Bearing Restrictions: No    Mobility  Bed Mobility   Bed Mobility: Supine to Sit     Supine to sit: Supervision     General bed mobility comments: cues for technique  Transfers Overall transfer level: Needs assistance Equipment used: Rolling walker (2 wheeled) Transfers: Sit to/from Stand Sit to Stand: Supervision         General transfer comment: cues for hand and  L leg position  Ambulation/Gait Ambulation/Gait assistance: Supervision Ambulation Distance (Feet): 400 Feet Assistive device: Rolling walker (2 wheeled) Gait Pattern/deviations: Step-through pattern     General Gait Details: cues for sequence and posture   Stairs            Wheelchair Mobility    Modified Rankin (Stroke Patients Only)       Balance                                    Cognition Arousal/Alertness: Awake/alert Behavior During Therapy: WFL for tasks assessed/performed Overall Cognitive Status: Within Functional Limits for tasks assessed                      Exercises Total Joint Exercises Ankle Circles/Pumps: AROM;Left;10 reps;Supine Quad Sets: AROM;Left;10 reps;Supine Short Arc Quad: AROM;Left;10 reps;Supine Heel Slides: AROM;Left;10 reps;Supine Hip ABduction/ADduction: AROM;Left;10 reps;Supine Straight Leg Raises: AROM;Left;10 reps;Supine Long Arc Quad: AROM;Left;10 reps;Seated    General Comments        Pertinent Vitals/Pain Pain Score: 1  Pain  Location: L thigh Pain Descriptors / Indicators: Aching;Sore Pain Intervention(s): Premedicated before session;Ice applied    Home Living Family/patient expects to be discharged to:: Private residence Living Arrangements: Spouse/significant other Available Help at Discharge: Family Type of Home: House Home Access: Stairs to enter   Home Layout: One level Home Equipment: Environmental consultantWalker - 2 wheels;Cane - single point      Prior Function Level of Independence: Independent          PT Goals (current goals can now be found in the care plan section) Progress towards PT goals: Progressing toward goals    Frequency  7X/week    PT Plan Current plan remains appropriate    Co-evaluation             End of Session   Activity Tolerance: Patient tolerated treatment well Patient left: in chair;with call bell/phone within reach;with family/visitor present     Time: 1022-1040 PT Time Calculation (min) (ACUTE ONLY): 18 min  Charges:  $Gait Training: 8-22 mins                    G Codes:      Rada HayHill, Jamielee Mchale Elizabeth 07/29/2014, 12:49 PM

## 2014-08-03 NOTE — Discharge Summary (Signed)
Physician Discharge Summary  Patient ID: Richard Curtis MRN: [id number] DOB/AGE: 70/08/1945 70 y.o.  Admit date: 07/28/2014 Discharge date: 07/29/2014   Procedures:  Procedure(s) (LRB): LEFT TOTAL HIP ARTHROPLASTY ANTERIOR APPROACH (Left)  Attending Physician:  Dr. Durene RomansMatthew Olin   Admission Diagnoses:   Left hip primary OA / pain  Discharge Diagnoses:  Principal Problem:   S/P left THA, AA  Past Medical History  Diagnosis Date  . Hypertension   . Arthritis     HPI: Richard Curtis, 68 y.o. male, has a history of pain and functional disability in the left hip(s) due to arthritis and patient has failed non-surgical conservative treatments for greater than 12 weeks to include NSAID's and/or analgesics, use of assistive devices and activity modification. Onset of symptoms was gradual starting 2-3 years ago with gradually worsening course since that time.The patient noted no past surgery on the left hip(s). Patient currently rates pain in the left hip at 10 out of 10 with activity. Patient has worsening of pain with activity and weight bearing, trendelenberg gait, pain that interfers with activities of daily living, pain with passive range of motion and crepitus. Patient has evidence of periarticular osteophytes and joint space narrowing by imaging studies. This condition presents safety issues increasing the risk of falls. There is no current active infection. Risks, benefits and expectations were discussed with the patient. Risks including but not limited to the risk of anesthesia, blood clots, nerve damage, blood vessel damage, failure of the prosthesis, infection and up to and including death. Patient understand the risks, benefits and expectations and wishes to proceed with surgery.   PCP: Lorelei PontMAHONEY,MARK, DO   Discharged Condition: good  Hospital Course:  Patient underwent the above stated procedure on 07/28/2014. Patient tolerated the procedure well and brought to the recovery  room in good condition and subsequently to the floor.  POD #1 BP: 102/65 ; Pulse: 53 ; Temp: 98.6 F (37 C) ; Resp: 16 Patient reports pain as mild, pain controlled. No events throughout the night. Ready to be discharged home. Dorsiflexion/plantar flexion intact, incision: dressing C/D/I, no cellulitis present and compartment soft.    LABS  Basename    HGB  12.4  HCT  37.1    Discharge Exam: General appearance: alert, cooperative and no distress Extremities: Homans sign is negative, no sign of DVT, no edema, redness or tenderness in the calves or thighs and no ulcers, gangrene or trophic changes  Disposition: Home with follow up in 2 weeks   Follow-up Information    Follow up with Shelda PalLIN,Chrystal Zeimet D, MD. Schedule an appointment as soon as possible for a visit in 2 weeks.   Specialty:  Orthopedic Surgery   Contact information:   [address] [place] [id number] [phone]       [facility].   Why:  home health physical therapy   Contact information:   [phone]      Discharge Instructions    Call MD / Call 911    Complete by:  As directed   If you experience chest pain or shortness of breath, CALL 911 and be transported to the hospital emergency room.  If you develope a fever above 101 F, pus (white drainage) or increased drainage or redness at the wound, or calf pain, call your surgeon's office.     Change dressing    Complete by:  As directed   Maintain surgical dressing for 10-14 days, or until follow up  in the clinic.     Constipation Prevention    Complete by:  As directed   Drink plenty of fluids.  Prune juice may be helpful.  You may use a stool softener, such as Colace (over the counter) 100 mg twice a day.  Use MiraLax (over the counter) for constipation as needed.     Diet - low sodium heart healthy    Complete by:  As directed      Discharge instructions    Complete by:  As directed   Maintain surgical dressing  for 10-14 days, or until follow up in the clinic. Follow up in 2 weeks at [facility]. Call with any questions or concerns.     Increase activity slowly as tolerated    Complete by:  As directed      TED hose    Complete by:  As directed   Use stockings (TED hose) for 2 weeks on both leg(s).  You may remove them at night for sleeping.     Weight bearing as tolerated    Complete by:  As directed   Laterality:  left  Extremity:  Lower             Medication List    STOP taking these medications        diclofenac 75 MG EC tablet  Commonly known as:  VOLTAREN     PENICILLIN V POTASSIUM PO      TAKE these medications        amLODipine 10 MG tablet  Commonly known as:  NORVASC  Take 10 mg by mouth every morning.     aspirin 325 MG EC tablet  Take 1 tablet (325 mg total) by mouth 2 (two) times daily.     atorvastatin 40 MG tablet  Commonly known as:  LIPITOR  Take 40 mg by mouth at bedtime.     benazepril 20 MG tablet  Commonly known as:  LOTENSIN  Take 20 mg by mouth every morning.     CINNAMON PLUS CHROMIUM PO  Take 1,000 mg by mouth 2 (two) times daily.     CO Q 10 PO  Take 1 tablet by mouth every morning.     DSS 100 MG Caps  Take 100 mg by mouth 2 (two) times daily.     ferrous sulfate 325 (65 FE) MG tablet  Take 1 tablet (325 mg total) by mouth 3 (three) times daily after meals.     GARLIC PO  Take 1 tablet by mouth every morning.     HYDROcodone-acetaminophen 7.5-325 MG per tablet  Commonly known as:  NORCO  Take 1-2 tablets by mouth every 4 (four) hours as needed for moderate pain.     KRILL OIL PO  Take 1 tablet by mouth every morning.     metoprolol 50 MG tablet  Commonly known as:  LOPRESSOR  Take 25 mg by mouth at bedtime.     multivitamin with minerals Tabs tablet  Take 1 tablet by mouth every morning.     polyethylene glycol packet  Commonly known as:  MIRALAX / GLYCOLAX  Take 17 g by mouth 2 (two) times daily.      PROBIOTIC DAILY PO  Take 1 tablet by mouth every morning.     tiZANidine 4 MG tablet  Commonly known as:  ZANAFLEX  Take 1 tablet (4 mg total) by mouth every 6 (six) hours as needed for muscle spasms.     Turmeric 500 MG  Caps  Take 1 capsule by mouth every morning.     VITAMIN D PO  Take 1 tablet by mouth every morning.         Signed: Anastasio AuerbachMatthew S. Lc Joynt   PA-C  08/03/2014, 2:32 PM

## 2015-11-04 IMAGING — DX DG HIP 1V PORT*L*
1 series · 1 of 1 positions shown · non-contrast
Comparison: None.

CLINICAL DATA: 68-year-old male status post left hip replacement.
Initial encounter.

EXAM:
PORTABLE LEFT HIP - 1 VIEW

[hip ap]
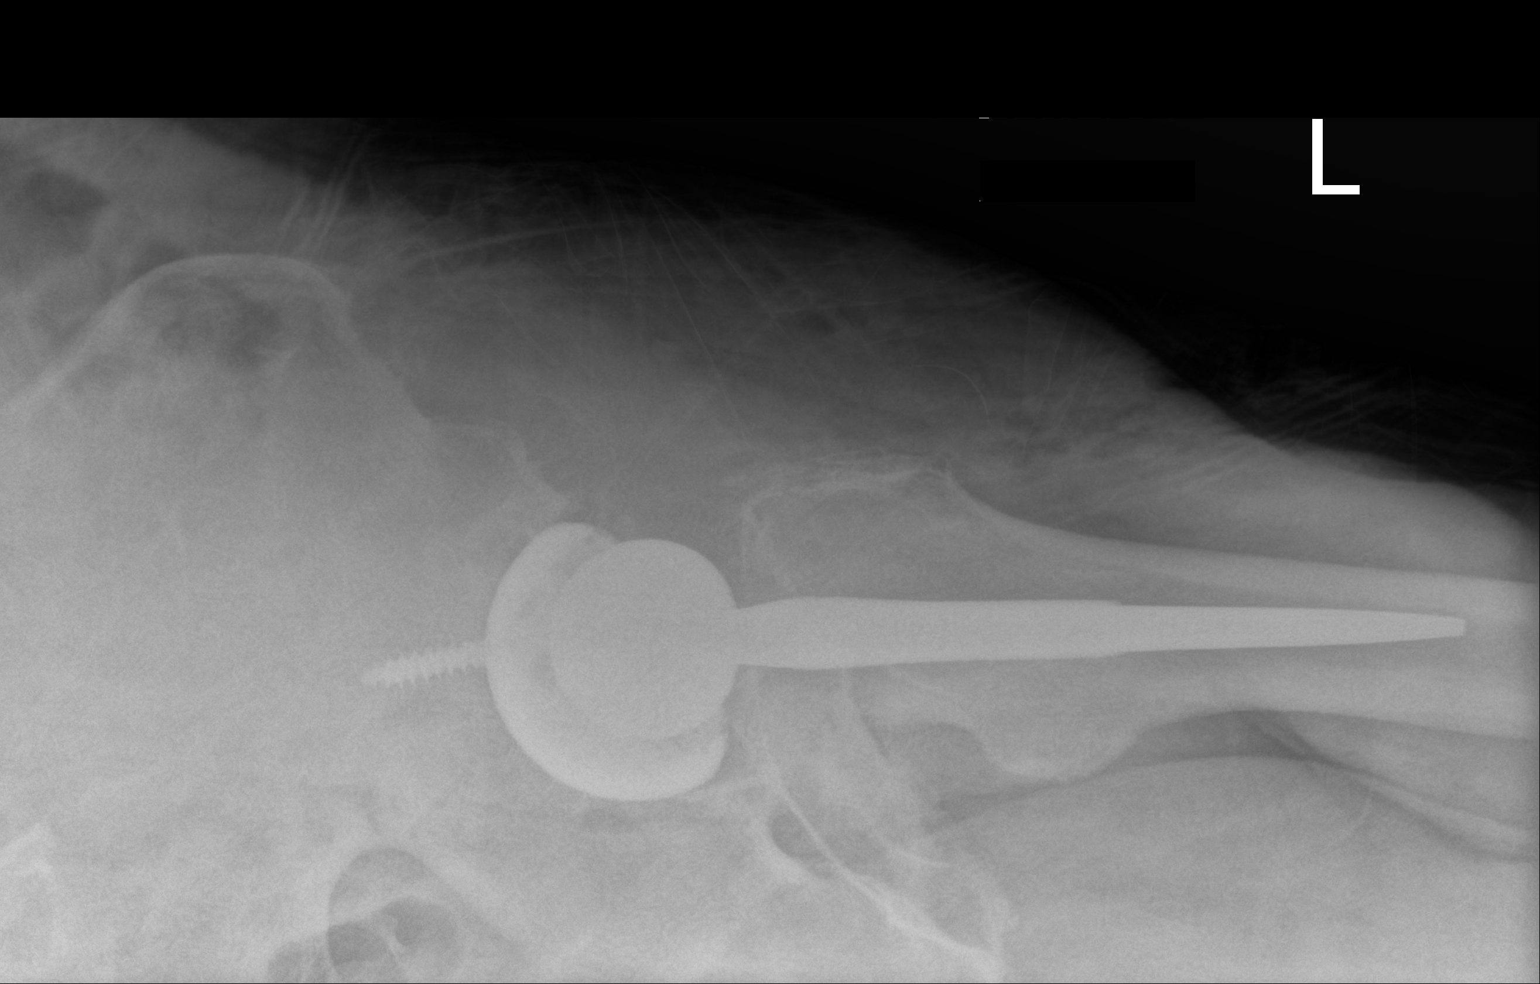

[1 of 1 positions shown; findings below may reference images not displayed]

FINDINGS: Portable cross-table lateral view at 5850 hrs. Left total hip
arthroplasty in place and normally aligned. Hardware appears intact.
IMPRESSION: Normal alignment of left total hip arthroplasty.
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
## 2019-05-01 NOTE — Patient Instructions (Addendum)
DUE TO COVID-19 ONLY ONE VISITOR IS ALLOWED TO COME WITH YOU AND STAY IN THE WAITING ROOM ONLY DURING PRE OP AND PROCEDURE DAY OF SURGERY. THE 1 VISITOR MAY VISIT WITH YOU AFTER SURGERY IN YOUR PRIVATE ROOM DURING VISITING HOURS ONLY!  YOU NEED TO HAVE A COVID 19 TEST ON 05-09-2019 AT 2:30 PM . COME TO [facility] SHORT STAY ENTRANCE ON [address]. ONCE YOUR COVID TEST IS COMPLETED, PLEASE BEGIN THE QUARANTINE INSTRUCTIONS AS OUTLINED IN YOUR HANDOUT.                Gwynn Crossley [place]    Your procedure is scheduled on: 05-13-2019   Report to [facility] Main  Entrance    Report to admitting at 9:00 AM     Call this number if you have problems the morning of surgery [phone]    Remember: NO SOLID FOOD AFTER MIDNIGHT THE NIGHT PRIOR TO SURGERY. NOTHING BY MOUTH EXCEPT CLEAR LIQUIDS UNTIL  8:30 AM. PLEASE FINISH ENSURE DRINK PER SURGEON ORDER  WHICH NEEDS TO BE COMPLETED AT 8:30AM.   CLEAR LIQUID DIET   Foods Allowed                                                                     Foods Excluded  Coffee and tea, regular and decaf                             liquids that you cannot  Plain Jell-O any favor except red or purple                                           see through such as: Fruit ices (not with fruit pulp)                                     milk, soups, orange juice  Iced Popsicles                                    All solid food Carbonated beverages, regular and diet                                    Cranberry, grape and apple juices Sports drinks like Gatorade Lightly seasoned clear broth or consume(fat free) Sugar, honey syrup _____________________________________________________________________     Take these medicines the morning of surgery with A SIP OF WATER: Amlodipine (Norvasc)   BRUSH YOUR TEETH MORNING OF SURGERY AND RINSE YOUR MOUTH OUT, NO CHEWING GUM CANDY OR MINTS.                                You may not have any metal on  your body including hair pins and  piercings     Do not wear jewelry, cologne, lotions, powders or deodorant                     Men may shave face and neck.   Do not bring valuables to the hospital. [facility] IS NOT             RESPONSIBLE   FOR VALUABLES.  Contacts, dentures or bridgework may not be worn into surgery.   Leave suitcase in the car. After surgery it may be brought to your room.                   Please read over the following fact sheets you were given: _____________________________________________________________________             [facility] - Preparing for Surgery Before surgery, you can play an important role.  Because skin is not sterile, your skin needs to be as free of germs as possible.  You can reduce the number of germs on your skin by washing with CHG (chlorahexidine gluconate) soap before surgery.  CHG is an antiseptic cleaner which kills germs and bonds with the skin to continue killing germs even after washing. Please DO NOT use if you have an allergy to CHG or antibacterial soaps.  If your skin becomes reddened/irritated stop using the CHG and inform your nurse when you arrive at Short Stay. Do not shave (including legs and underarms) for at least 48 hours prior to the first CHG shower.  You may shave your face/neck. Please follow these instructions carefully:  1.  Shower with CHG Soap the night before surgery and the  morning of Surgery.  2.  If you choose to wash your hair, wash your hair first as usual with your  normal  shampoo.  3.  After you shampoo, rinse your hair and body thoroughly to remove the  shampoo.                           4.  Use CHG as you would any other liquid soap.  You can apply chg directly  to the skin and wash                       Gently with a scrungie or clean washcloth.  5.  Apply the CHG Soap to your body ONLY FROM THE NECK DOWN.   Do not use on face/ open                           Wound or open sores.  Avoid contact with eyes, ears mouth and genitals (private parts).                       Wash face,  Genitals (private parts) with your normal soap.             6.  Wash thoroughly, paying special attention to the area where your surgery  will be performed.  7.  Thoroughly rinse your body with warm water from the neck down.  8.  DO NOT shower/wash with your normal soap after using and rinsing off  the CHG Soap.                9.  Pat yourself dry with a clean towel.  10.  Wear clean pajamas.            11.  Place clean sheets on your bed the night of your first shower and do not  sleep with pets. Day of Surgery : Do not apply any lotions/deodorants the morning of surgery.  Please wear clean clothes to the hospital/surgery center.  FAILURE TO FOLLOW THESE INSTRUCTIONS MAY RESULT IN THE CANCELLATION OF YOUR SURGERY PATIENT SIGNATURE_________________________________  NURSE SIGNATURE__________________________________  ________________________________________________________________________   Rogelia Mire  An incentive spirometer is a tool that can help keep your lungs clear and active. This tool measures how well you are filling your lungs with each breath. Taking long deep breaths may help reverse or decrease the chance of developing breathing (pulmonary) problems (especially infection) following:  A long period of time when you are unable to move or be active. BEFORE THE PROCEDURE   If the spirometer includes an indicator to show your best effort, your nurse or respiratory therapist will set it to a desired goal.  If possible, sit up straight or lean slightly forward. Try not to slouch.  Hold the incentive spirometer in an upright position. INSTRUCTIONS FOR USE  1. Sit on the edge of your bed if possible, or sit up as far as you can in bed or on a chair. 2. Hold the incentive spirometer in an upright position. 3. Breathe out normally. 4. Place the mouthpiece in your  mouth and seal your lips tightly around it. 5. Breathe in slowly and as deeply as possible, raising the piston or the ball toward the top of the column. 6. Hold your breath for 3-5 seconds or for as long as possible. Allow the piston or ball to fall to the bottom of the column. 7. Remove the mouthpiece from your mouth and breathe out normally. 8. Rest for a few seconds and repeat Steps 1 through 7 at least 10 times every 1-2 hours when you are awake. Take your time and take a few normal breaths between deep breaths. 9. The spirometer may include an indicator to show your best effort. Use the indicator as a goal to work toward during each repetition. 10. After each set of 10 deep breaths, practice coughing to be sure your lungs are clear. If you have an incision (the cut made at the time of surgery), support your incision when coughing by placing a pillow or rolled up towels firmly against it. Once you are able to get out of bed, walk around indoors and cough well. You may stop using the incentive spirometer when instructed by your caregiver.  RISKS AND COMPLICATIONS  Take your time so you do not get dizzy or light-headed.  If you are in pain, you may need to take or ask for pain medication before doing incentive spirometry. It is harder to take a deep breath if you are having pain. AFTER USE  Rest and breathe slowly and easily.  It can be helpful to keep track of a log of your progress. Your caregiver can provide you with a simple table to help with this. If you are using the spirometer at home, follow these instructions: SEEK MEDICAL CARE IF:   You are having difficultly using the spirometer.  You have trouble using the spirometer as often as instructed.  Your pain medication is not giving enough relief while using the spirometer.  You develop fever of 100.5 F (38.1 C) or higher. SEEK IMMEDIATE MEDICAL CARE IF:   You cough up bloody sputum  that had not been present before.  You  develop fever of 102 F (38.9 C) or greater.  You develop worsening pain at or near the incision site. MAKE SURE YOU:   Understand these instructions.  Will watch your condition.  Will get help right away if you are not doing well or get worse. Document Released: 12/18/2006 Document Revised: 10/30/2011 Document Reviewed: 02/18/2007 ExitCare Patient Information 2014 ExitCare, [place].   ________________________________________________________________________  WHAT IS A BLOOD TRANSFUSION? Blood Transfusion Information  A transfusion is the replacement of blood or some of its parts. Blood is made up of multiple cells which provide different functions.  Red blood cells carry oxygen and are used for blood loss replacement.  White blood cells fight against infection.  Platelets control bleeding.  Plasma helps clot blood.  Other blood products are available for specialized needs, such as hemophilia or other clotting disorders. BEFORE THE TRANSFUSION  Who gives blood for transfusions?   Healthy volunteers who are fully evaluated to make sure their blood is safe. This is blood bank blood. Transfusion therapy is the safest it has ever been in the practice of medicine. Before blood is taken from a donor, a complete history is taken to make sure that person has no history of diseases nor engages in risky social behavior (examples are intravenous drug use or sexual activity with multiple partners). The donor's travel history is screened to minimize risk of transmitting infections, such as malaria. The donated blood is tested for signs of infectious diseases, such as HIV and hepatitis. The blood is then tested to be sure it is compatible with you in order to minimize the chance of a transfusion reaction. If you or a relative donates blood, this is often done in anticipation of surgery and is not appropriate for emergency situations. It takes many days to process the donated blood. RISKS AND  COMPLICATIONS Although transfusion therapy is very safe and saves many lives, the main dangers of transfusion include:   Getting an infectious disease.  Developing a transfusion reaction. This is an allergic reaction to something in the blood you were given. Every precaution is taken to prevent this. The decision to have a blood transfusion has been considered carefully by your caregiver before blood is given. Blood is not given unless the benefits outweigh the risks. AFTER THE TRANSFUSION  Right after receiving a blood transfusion, you will usually feel much better and more energetic. This is especially true if your red blood cells have gotten low (anemic). The transfusion raises the level of the red blood cells which carry oxygen, and this usually causes an energy increase.  The nurse administering the transfusion will monitor you carefully for complications. HOME CARE INSTRUCTIONS  No special instructions are needed after a transfusion. You may find your energy is better. Speak with your caregiver about any limitations on activity for underlying diseases you may have. SEEK MEDICAL CARE IF:   Your condition is not improving after your transfusion.  You develop redness or irritation at the intravenous (IV) site. SEEK IMMEDIATE MEDICAL CARE IF:  Any of the following symptoms occur over the next 12 hours:  Shaking chills.  You have a temperature by mouth above 102 F (38.9 C), not controlled by medicine.  Chest, back, or muscle pain.  People around you feel you are not acting correctly or are confused.  Shortness of breath or difficulty breathing.  Dizziness and fainting.  You get a rash or develop hives.  You have a decrease  in urine output.  Your urine turns a dark color or changes to pink, red, or brown. Any of the following symptoms occur over the next 10 days:  You have a temperature by mouth above 102 F (38.9 C), not controlled by medicine.  Shortness of  breath.  Weakness after normal activity.  The white part of the eye turns yellow (jaundice).  You have a decrease in the amount of urine or are urinating less often.  Your urine turns a dark color or changes to pink, red, or brown. Document Released: 08/04/2000 Document Revised: 10/30/2011 Document Reviewed: 03/23/2008 [facility] Patient Information 2014 [place], [place].  _______________________________________________________________________

## 2019-05-08 ENCOUNTER — Other Ambulatory Visit: Payer: Self-pay

## 2019-05-08 ENCOUNTER — Other Ambulatory Visit (HOSPITAL_COMMUNITY): Payer: Medicare Other

## 2019-05-08 ENCOUNTER — Encounter (HOSPITAL_COMMUNITY)
Admission: RE | Admit: 2019-05-08 | Discharge: 2019-05-08 | Disposition: A | Discharge status: Home / Self Care | Payer: Medicare Other | Source: Ambulatory Visit | Attending: Orthopedic Surgery | Admitting: Orthopedic Surgery

## 2019-05-08 ENCOUNTER — Encounter (HOSPITAL_COMMUNITY): Payer: Self-pay

## 2019-05-08 DIAGNOSIS — I10 Essential (primary) hypertension: Secondary | ICD-10-CM | POA: Insufficient documentation

## 2019-05-08 DIAGNOSIS — Z01818 Encounter for other preprocedural examination: Secondary | ICD-10-CM | POA: Insufficient documentation

## 2019-05-08 DIAGNOSIS — M1611 Unilateral primary osteoarthritis, right hip: Secondary | ICD-10-CM | POA: Diagnosis not present

## 2019-05-08 LAB — CBC
HCT: 47 % (ref 39.0–52.0)
Hemoglobin: 15.9 g/dL (ref 13.0–17.0)
MCH: 31.4 pg (ref 26.0–34.0)
MCHC: 33.8 g/dL (ref 30.0–36.0)
MCV: 92.9 fL (ref 80.0–100.0)
Platelets: 313 10*3/uL (ref 150–400)
RBC: 5.06 MIL/uL (ref 4.22–5.81)
RDW: 12.6 % (ref 11.5–15.5)
WBC: 13.4 10*3/uL — ABNORMAL HIGH (ref 4.0–10.5)
nRBC: 0 % (ref 0.0–0.2)

## 2019-05-08 LAB — BASIC METABOLIC PANEL
Anion gap: 11 (ref 5–15)
BUN: 14 mg/dL (ref 8–23)
CO2: 21 mmol/L — ABNORMAL LOW (ref 22–32)
Calcium: 9.6 mg/dL (ref 8.9–10.3)
Chloride: 101 mmol/L (ref 98–111)
Creatinine, Ser: 0.98 mg/dL (ref 0.61–1.24)
GFR calc Af Amer: >60 mL/min (ref >60)
GFR calc non Af Amer: >60 mL/min (ref >60)
Glucose, Bld: 119 mg/dL — ABNORMAL HIGH (ref 70–99)
Potassium: 4.3 mmol/L (ref 3.5–5.1)
Sodium: 133 mmol/L — ABNORMAL LOW (ref 135–145)

## 2019-05-08 LAB — SURGICAL PCR SCREEN
MRSA, PCR: NEGATIVE
Staphylococcus aureus: NEGATIVE

## 2019-05-08 NOTE — Progress Notes (Signed)
PCP - Emelda Fear Cardiologist -   Chest x-ray -  EKG - 05-08-19 Stress Test -  ECHO -  Cardiac Cath -   Sleep Study -  CPAP -   Fasting Blood Sugar -  Checks Blood Sugar _____ times a day  Blood Thinner Instructions: Aspirin Instructions: Last Dose:  Anesthesia review:   Patient denies shortness of breath, fever, cough and chest pain at PAT appointment   Patient verbalized understanding of instructions that were given to them at the PAT appointment. Patient was also instructed that they will need to review over the PAT instructions again at home before surgery.

## 2019-05-09 ENCOUNTER — Other Ambulatory Visit (HOSPITAL_COMMUNITY)
Admission: RE | Admit: 2019-05-09 | Discharge: 2019-05-09 | Disposition: A | Discharge status: Home / Self Care | Payer: Medicare Other | Source: Ambulatory Visit | Attending: Orthopedic Surgery | Admitting: Orthopedic Surgery

## 2019-05-09 DIAGNOSIS — Z01812 Encounter for preprocedural laboratory examination: Secondary | ICD-10-CM | POA: Insufficient documentation

## 2019-05-09 DIAGNOSIS — Z20828 Contact with and (suspected) exposure to other viral communicable diseases: Secondary | ICD-10-CM | POA: Insufficient documentation

## 2019-05-09 LAB — SARS CORONAVIRUS 2 (TAT 6-24 HRS): SARS Coronavirus 2: NEGATIVE

## 2019-05-13 ENCOUNTER — Observation Stay (HOSPITAL_COMMUNITY)
Admission: RE | Admit: 2019-05-13 | Discharge: 2019-05-14 | Disposition: A | Discharge status: Home / Self Care | Payer: Medicare Other | Attending: Orthopedic Surgery | Admitting: Orthopedic Surgery

## 2019-05-13 ENCOUNTER — Encounter (HOSPITAL_COMMUNITY): Payer: Self-pay | Admitting: *Deleted

## 2019-05-13 ENCOUNTER — Observation Stay (HOSPITAL_COMMUNITY): Payer: Medicare Other

## 2019-05-13 ENCOUNTER — Inpatient Hospital Stay (HOSPITAL_COMMUNITY): Payer: Medicare Other | Admitting: Certified Registered Nurse Anesthetist

## 2019-05-13 ENCOUNTER — Inpatient Hospital Stay (HOSPITAL_COMMUNITY): Payer: Medicare Other

## 2019-05-13 ENCOUNTER — Inpatient Hospital Stay (HOSPITAL_COMMUNITY): Payer: Medicare Other | Admitting: Physician Assistant

## 2019-05-13 ENCOUNTER — Encounter (HOSPITAL_COMMUNITY)
Admission: RE | Disposition: A | Discharge status: Home / Self Care | Payer: Self-pay | Source: Home / Self Care | Attending: Orthopedic Surgery

## 2019-05-13 ENCOUNTER — Other Ambulatory Visit: Payer: Self-pay

## 2019-05-13 DIAGNOSIS — Z96642 Presence of left artificial hip joint: Secondary | ICD-10-CM | POA: Insufficient documentation

## 2019-05-13 DIAGNOSIS — Z79899 Other long term (current) drug therapy: Secondary | ICD-10-CM | POA: Insufficient documentation

## 2019-05-13 DIAGNOSIS — Z87891 Personal history of nicotine dependence: Secondary | ICD-10-CM | POA: Insufficient documentation

## 2019-05-13 DIAGNOSIS — I10 Essential (primary) hypertension: Secondary | ICD-10-CM | POA: Diagnosis not present

## 2019-05-13 DIAGNOSIS — Z96649 Presence of unspecified artificial hip joint: Secondary | ICD-10-CM

## 2019-05-13 DIAGNOSIS — M1611 Unilateral primary osteoarthritis, right hip: Secondary | ICD-10-CM | POA: Diagnosis present

## 2019-05-13 DIAGNOSIS — Z6834 Body mass index (BMI) 34.0-34.9, adult: Secondary | ICD-10-CM | POA: Diagnosis not present

## 2019-05-13 DIAGNOSIS — R269 Unspecified abnormalities of gait and mobility: Secondary | ICD-10-CM | POA: Diagnosis not present

## 2019-05-13 DIAGNOSIS — E669 Obesity, unspecified: Secondary | ICD-10-CM | POA: Diagnosis present

## 2019-05-13 DIAGNOSIS — Z96641 Presence of right artificial hip joint: Secondary | ICD-10-CM

## 2019-05-13 DIAGNOSIS — Z419 Encounter for procedure for purposes other than remedying health state, unspecified: Secondary | ICD-10-CM

## 2019-05-13 HISTORY — PX: TOTAL HIP ARTHROPLASTY: SHX124

## 2019-05-13 LAB — TYPE AND SCREEN
ABO/RH(D): O POS
Antibody Screen: NEGATIVE

## 2019-05-13 SURGERY — ARTHROPLASTY, HIP, TOTAL, ANTERIOR APPROACH
Anesthesia: Spinal | Site: Hip | Laterality: Right

## 2019-05-13 MED ORDER — MAGNESIUM CITRATE PO SOLN: 1.0000 | Freq: Once | ORAL | Status: DC | PRN

## 2019-05-13 MED ORDER — AMLODIPINE BESYLATE 10 MG PO TABS
10.0000 mg | ORAL_TABLET | Freq: Every day | ORAL | Status: DC
  Administered 2019-05-14: 10 mg via ORAL
  Filled 2019-05-13: qty 1

## 2019-05-13 MED ORDER — DOCUSATE SODIUM 100 MG PO CAPS
100.0000 mg | ORAL_CAPSULE | Freq: Two times a day (BID) | ORAL | Status: DC
  Administered 2019-05-13 – 2019-05-14 (×2): 100 mg via ORAL
  Filled 2019-05-13 (×2): qty 1

## 2019-05-13 MED ORDER — CEFAZOLIN SODIUM-DEXTROSE 2-4 GM/100ML-% IV SOLN
INTRAVENOUS | Status: AC
  Filled 2019-05-13: qty 100

## 2019-05-13 MED ORDER — ONDANSETRON HCL 4 MG PO TABS: 4.0000 mg | ORAL_TABLET | Freq: Four times a day (QID) | ORAL | Status: DC | PRN

## 2019-05-13 MED ORDER — CEFAZOLIN SODIUM-DEXTROSE 2-4 GM/100ML-% IV SOLN
2.0000 g | INTRAVENOUS | Status: AC
  Administered 2019-05-13: 2 g via INTRAVENOUS

## 2019-05-13 MED ORDER — EPHEDRINE SULFATE-NACL 50-0.9 MG/10ML-% IV SOSY
PREFILLED_SYRINGE | INTRAVENOUS | Status: DC | PRN
  Administered 2019-05-13: 10 mg via INTRAVENOUS
  Administered 2019-05-13: 5 mg via INTRAVENOUS
  Administered 2019-05-13: 10 mg via INTRAVENOUS

## 2019-05-13 MED ORDER — SODIUM CHLORIDE 0.9 % IR SOLN
Status: DC | PRN
  Administered 2019-05-13: 1000 mL

## 2019-05-13 MED ORDER — HYDROMORPHONE HCL 1 MG/ML IJ SOLN: 0.5000 mg | INTRAMUSCULAR | Status: DC | PRN

## 2019-05-13 MED ORDER — FENTANYL CITRATE (PF) 100 MCG/2ML IJ SOLN
INTRAMUSCULAR | Status: AC
  Filled 2019-05-13: qty 2

## 2019-05-13 MED ORDER — METOPROLOL SUCCINATE ER 25 MG PO TB24
25.0000 mg | ORAL_TABLET | Freq: Every evening | ORAL | Status: DC
  Administered 2019-05-13: 25 mg via ORAL
  Filled 2019-05-13: qty 1

## 2019-05-13 MED ORDER — PROPOFOL 10 MG/ML IV BOLUS
INTRAVENOUS | Status: AC
  Filled 2019-05-13: qty 60

## 2019-05-13 MED ORDER — ONDANSETRON HCL 4 MG/2ML IJ SOLN: 4.0000 mg | Freq: Four times a day (QID) | INTRAMUSCULAR | Status: DC | PRN

## 2019-05-13 MED ORDER — MEPERIDINE HCL 50 MG/ML IJ SOLN: 6.2500 mg | INTRAMUSCULAR | Status: DC | PRN

## 2019-05-13 MED ORDER — ONDANSETRON HCL 4 MG/2ML IJ SOLN
INTRAMUSCULAR | Status: AC
  Filled 2019-05-13: qty 2

## 2019-05-13 MED ORDER — LACTATED RINGERS IV SOLN
INTRAVENOUS | Status: DC
  Administered 2019-05-13 (×2): via INTRAVENOUS

## 2019-05-13 MED ORDER — METOCLOPRAMIDE HCL 5 MG/ML IJ SOLN: 5.0000 mg | Freq: Three times a day (TID) | INTRAMUSCULAR | Status: DC | PRN

## 2019-05-13 MED ORDER — DIPHENHYDRAMINE HCL 12.5 MG/5ML PO ELIX: 12.5000 mg | ORAL_SOLUTION | ORAL | Status: DC | PRN

## 2019-05-13 MED ORDER — BISACODYL 10 MG RE SUPP: 10.0000 mg | Freq: Every day | RECTAL | Status: DC | PRN

## 2019-05-13 MED ORDER — METOCLOPRAMIDE HCL 5 MG/ML IJ SOLN: 10.0000 mg | Freq: Once | INTRAMUSCULAR | Status: DC | PRN

## 2019-05-13 MED ORDER — PROPOFOL 10 MG/ML IV BOLUS
INTRAVENOUS | Status: DC | PRN
  Administered 2019-05-13: 20 mg via INTRAVENOUS
  Administered 2019-05-13: 40 mg via INTRAVENOUS

## 2019-05-13 MED ORDER — PROPOFOL 500 MG/50ML IV EMUL
INTRAVENOUS | Status: DC | PRN
  Administered 2019-05-13: 70 ug/kg/min via INTRAVENOUS

## 2019-05-13 MED ORDER — POLYETHYLENE GLYCOL 3350 17 G PO PACK
17.0000 g | PACK | Freq: Two times a day (BID) | ORAL | Status: DC
  Administered 2019-05-13 – 2019-05-14 (×2): 17 g via ORAL
  Filled 2019-05-13 (×2): qty 1

## 2019-05-13 MED ORDER — POVIDONE-IODINE 10 % EX SWAB
2.0000 "application " | Freq: Once | CUTANEOUS | Status: AC
  Administered 2019-05-13: 2 via TOPICAL

## 2019-05-13 MED ORDER — PHENOL 1.4 % MT LIQD: 1.0000 | OROMUCOSAL | Status: DC | PRN

## 2019-05-13 MED ORDER — METOCLOPRAMIDE HCL 5 MG PO TABS: 5.0000 mg | ORAL_TABLET | Freq: Three times a day (TID) | ORAL | Status: DC | PRN

## 2019-05-13 MED ORDER — FERROUS SULFATE 325 (65 FE) MG PO TABS
325.0000 mg | ORAL_TABLET | Freq: Three times a day (TID) | ORAL | Status: DC
  Administered 2019-05-14: 325 mg via ORAL
  Filled 2019-05-13: qty 1

## 2019-05-13 MED ORDER — METHOCARBAMOL 500 MG PO TABS
500.0000 mg | ORAL_TABLET | Freq: Four times a day (QID) | ORAL | Status: DC | PRN
  Administered 2019-05-13: 500 mg via ORAL
  Filled 2019-05-13: qty 1

## 2019-05-13 MED ORDER — CHLORHEXIDINE GLUCONATE 4 % EX LIQD: 60.0000 mL | Freq: Once | CUTANEOUS | Status: DC

## 2019-05-13 MED ORDER — FENTANYL CITRATE (PF) 100 MCG/2ML IJ SOLN
INTRAMUSCULAR | Status: DC | PRN
  Administered 2019-05-13 (×2): 50 ug via INTRAVENOUS

## 2019-05-13 MED ORDER — METHOCARBAMOL 500 MG IVPB - SIMPLE MED
500.0000 mg | Freq: Four times a day (QID) | INTRAVENOUS | Status: DC | PRN
  Administered 2019-05-13: 500 mg via INTRAVENOUS
  Filled 2019-05-13: qty 50

## 2019-05-13 MED ORDER — EPHEDRINE 5 MG/ML INJ
INTRAVENOUS | Status: AC
  Filled 2019-05-13: qty 10

## 2019-05-13 MED ORDER — METHOCARBAMOL 500 MG IVPB - SIMPLE MED
INTRAVENOUS | Status: AC
  Filled 2019-05-13: qty 50

## 2019-05-13 MED ORDER — DEXAMETHASONE SODIUM PHOSPHATE 10 MG/ML IJ SOLN
INTRAMUSCULAR | Status: AC
  Filled 2019-05-13: qty 1

## 2019-05-13 MED ORDER — TRANEXAMIC ACID-NACL 1000-0.7 MG/100ML-% IV SOLN
1000.0000 mg | Freq: Once | INTRAVENOUS | Status: AC
  Administered 2019-05-13: 1000 mg via INTRAVENOUS
  Filled 2019-05-13: qty 100

## 2019-05-13 MED ORDER — ONDANSETRON HCL 4 MG/2ML IJ SOLN
INTRAMUSCULAR | Status: DC | PRN
  Administered 2019-05-13: 4 mg via INTRAVENOUS

## 2019-05-13 MED ORDER — TRANEXAMIC ACID-NACL 1000-0.7 MG/100ML-% IV SOLN
1000.0000 mg | INTRAVENOUS | Status: AC
  Administered 2019-05-13: 1000 mg via INTRAVENOUS

## 2019-05-13 MED ORDER — HYDROCODONE-ACETAMINOPHEN 7.5-325 MG PO TABS: 1.0000 | ORAL_TABLET | ORAL | Status: DC | PRN

## 2019-05-13 MED ORDER — ALUM & MAG HYDROXIDE-SIMETH 200-200-20 MG/5ML PO SUSP: 15.0000 mL | ORAL | Status: DC | PRN

## 2019-05-13 MED ORDER — BUPIVACAINE IN DEXTROSE 0.75-8.25 % IT SOLN
INTRATHECAL | Status: DC | PRN
  Administered 2019-05-13: 1.6 mL via INTRATHECAL

## 2019-05-13 MED ORDER — TRANEXAMIC ACID-NACL 1000-0.7 MG/100ML-% IV SOLN
INTRAVENOUS | Status: AC
  Filled 2019-05-13: qty 100

## 2019-05-13 MED ORDER — FENTANYL CITRATE (PF) 100 MCG/2ML IJ SOLN: 25.0000 ug | INTRAMUSCULAR | Status: DC | PRN

## 2019-05-13 MED ORDER — DEXAMETHASONE SODIUM PHOSPHATE 10 MG/ML IJ SOLN
INTRAMUSCULAR | Status: DC | PRN
  Administered 2019-05-13: 10 mg via INTRAVENOUS

## 2019-05-13 MED ORDER — DEXAMETHASONE SODIUM PHOSPHATE 10 MG/ML IJ SOLN
10.0000 mg | Freq: Once | INTRAMUSCULAR | Status: AC
  Administered 2019-05-14: 10 mg via INTRAVENOUS
  Filled 2019-05-13: qty 1

## 2019-05-13 MED ORDER — SODIUM CHLORIDE 0.9 % IV SOLN
INTRAVENOUS | Status: DC
  Administered 2019-05-13 – 2019-05-14 (×2): via INTRAVENOUS

## 2019-05-13 MED ORDER — CELECOXIB 200 MG PO CAPS
200.0000 mg | ORAL_CAPSULE | Freq: Two times a day (BID) | ORAL | Status: DC
  Administered 2019-05-13 – 2019-05-14 (×3): 200 mg via ORAL
  Filled 2019-05-13 (×3): qty 1

## 2019-05-13 MED ORDER — ASPIRIN 81 MG PO CHEW
81.0000 mg | CHEWABLE_TABLET | Freq: Two times a day (BID) | ORAL | Status: DC
  Administered 2019-05-13 – 2019-05-14 (×2): 81 mg via ORAL
  Filled 2019-05-13 (×2): qty 1

## 2019-05-13 MED ORDER — DEXAMETHASONE SODIUM PHOSPHATE 10 MG/ML IJ SOLN: 10.0000 mg | Freq: Once | INTRAMUSCULAR | Status: DC

## 2019-05-13 MED ORDER — PROPOFOL 10 MG/ML IV BOLUS
INTRAVENOUS | Status: AC
  Filled 2019-05-13: qty 20

## 2019-05-13 MED ORDER — ACETAMINOPHEN 325 MG PO TABS: 325.0000 mg | ORAL_TABLET | Freq: Four times a day (QID) | ORAL | Status: DC | PRN

## 2019-05-13 MED ORDER — ATORVASTATIN CALCIUM 40 MG PO TABS
80.0000 mg | ORAL_TABLET | Freq: Every day | ORAL | Status: DC
  Administered 2019-05-13: 80 mg via ORAL
  Filled 2019-05-13: qty 2

## 2019-05-13 MED ORDER — HYDROCODONE-ACETAMINOPHEN 5-325 MG PO TABS
1.0000 | ORAL_TABLET | ORAL | Status: DC | PRN
  Administered 2019-05-13: 1 via ORAL
  Administered 2019-05-13: 2 via ORAL
  Administered 2019-05-13: 1 via ORAL
  Administered 2019-05-14: 2 via ORAL
  Filled 2019-05-13 (×2): qty 1
  Filled 2019-05-13 (×2): qty 2

## 2019-05-13 MED ORDER — MENTHOL 3 MG MT LOZG: 1.0000 | LOZENGE | OROMUCOSAL | Status: DC | PRN

## 2019-05-13 MED ORDER — CEFAZOLIN SODIUM-DEXTROSE 2-4 GM/100ML-% IV SOLN
2.0000 g | Freq: Four times a day (QID) | INTRAVENOUS | Status: AC
  Administered 2019-05-13 (×2): 2 g via INTRAVENOUS
  Filled 2019-05-13 (×2): qty 100

## 2019-05-13 SURGICAL SUPPLY — 39 items
BAG ZIPLOCK 12X15 (MISCELLANEOUS) IMPLANT
BLADE SAG 18X100X1.27 (BLADE) ×3 IMPLANT
COVER PERINEAL POST (MISCELLANEOUS) ×3 IMPLANT
COVER SURGICAL LIGHT HANDLE (MISCELLANEOUS) ×3 IMPLANT
COVER WAND RF STERILE (DRAPES) IMPLANT
CUP ACETBLR 54 OD PINNACLE (Hips) ×3 IMPLANT
DERMABOND ADVANCED (GAUZE/BANDAGES/DRESSINGS) ×2
DERMABOND ADVANCED .7 DNX12 (GAUZE/BANDAGES/DRESSINGS) ×1 IMPLANT
DRAPE STERI IOBAN 125X83 (DRAPES) ×3 IMPLANT
DRAPE U-SHAPE 47X51 STRL (DRAPES) ×6 IMPLANT
DRESSING AQUACEL AG SP 3.5X10 (GAUZE/BANDAGES/DRESSINGS) ×1 IMPLANT
DRSG AQUACEL AG SP 3.5X10 (GAUZE/BANDAGES/DRESSINGS) ×3
DURAPREP 26ML APPLICATOR (WOUND CARE) ×3 IMPLANT
ELECT REM PT RETURN 15FT ADLT (MISCELLANEOUS) ×3 IMPLANT
ELIMINATOR HOLE APEX DEPUY (Hips) ×3 IMPLANT
GLOVE BIO SURGEON STRL SZ 6 (GLOVE) ×6 IMPLANT
GLOVE BIOGEL PI IND STRL 6.5 (GLOVE) ×1 IMPLANT
GLOVE BIOGEL PI IND STRL 7.5 (GLOVE) ×1 IMPLANT
GLOVE BIOGEL PI INDICATOR 6.5 (GLOVE) ×2
GLOVE BIOGEL PI INDICATOR 7.5 (GLOVE) ×2
GLOVE ORTHO TXT STRL SZ7.5 (GLOVE) ×6 IMPLANT
GOWN STRL REUS W/TWL LRG LVL3 (GOWN DISPOSABLE) ×6 IMPLANT
HEAD M SROM 36MM PLUS 1.5 (Hips) ×1 IMPLANT
HOLDER FOLEY CATH W/STRAP (MISCELLANEOUS) ×3 IMPLANT
KIT TURNOVER KIT A (KITS) IMPLANT
LINER NEUTRAL 54X36MM PLUS 4 (Hips) ×3 IMPLANT
PACK ANTERIOR HIP CUSTOM (KITS) ×3 IMPLANT
SCREW 6.5MMX30MM (Screw) ×3 IMPLANT
SROM M HEAD 36MM PLUS 1.5 (Hips) ×3 IMPLANT
STEM FEM ACTIS HIGH SZ8 (Stem) ×3 IMPLANT
SUT MNCRL AB 4-0 PS2 18 (SUTURE) ×3 IMPLANT
SUT STRATAFIX 0 PDS 27 VIOLET (SUTURE) ×3
SUT VIC AB 1 CT1 36 (SUTURE) ×9 IMPLANT
SUT VIC AB 2-0 CT1 27 (SUTURE) ×4
SUT VIC AB 2-0 CT1 TAPERPNT 27 (SUTURE) ×2 IMPLANT
SUTURE STRATFX 0 PDS 27 VIOLET (SUTURE) ×1 IMPLANT
TRAY FOLEY MTR SLVR 16FR STAT (SET/KITS/TRAYS/PACK) ×3 IMPLANT
WATER STERILE IRR 1000ML POUR (IV SOLUTION) ×6 IMPLANT
YANKAUER SUCT BULB TIP 10FT TU (MISCELLANEOUS) ×3 IMPLANT

## 2019-05-13 NOTE — Transfer of Care (Signed)
Immediate Anesthesia Transfer of Care Note  Patient: Richard Curtis  Procedure(s) Performed: TOTAL HIP ARTHROPLASTY ANTERIOR APPROACH (Right Hip)  Patient Location: PACU  Anesthesia Type:Spinal  Level of Consciousness: awake, alert  and oriented  Airway & Oxygen Therapy: Patient Spontanous Breathing and Patient connected to face mask oxygen  Post-op Assessment: Report given to RN and Post -op Vital signs reviewed and stable  Post vital signs: Reviewed and stable  Last Vitals:  Vitals Value Taken Time  BP 132/120 05/13/19 1242  Temp    Pulse 70 05/13/19 1245  Resp 17 05/13/19 1245  SpO2 99 % 05/13/19 1245  Vitals shown include unvalidated device data.  Last Pain:  Vitals:   05/13/19 0847  TempSrc: Oral      Patients Stated Pain Goal: 4 (80/99/83 3825)  Complications: No apparent anesthesia complications

## 2019-05-13 NOTE — Evaluation (Signed)
Physical Therapy Evaluation Patient Details Name: Richard Curtis MRN: [id number] DOB: 10/04/1945 Today's Date: 05/13/2019   History of Present Illness  Pt is 73 y.o. male s/p Rt THA ant approach on 05/13/19 with PMH significant for HTN, arthritis, and Lt THA in 2015.  Clinical Impression  Richard Curtis is a 73 y.o. male POD 0 s/p Rt THA ant approach. Patient reports modified independence with mobility at baseline using SPC due to Rt hip pain prior to surgery. Patient is now limited by functional impairments (see PT problem list below) and requires Min assist for transfers and gait with RW. Patient was able to ambulate ~100 feet with RW and min guard/assist. Patient instructed in exercise to facilitate ROM and circulation to manage edema. Patient will benefit from continued skilled PT interventions to address impairments and progress towards PLOF. Acute PT will follow to progress mobility and stair training in preparation for safe discharge home.     Follow Up Recommendations Follow surgeon's recommendation for DC plan and follow-up therapies    Equipment Recommendations  None recommended by PT    Recommendations for Other Services       Precautions / Restrictions Precautions Precautions: Fall Restrictions Weight Bearing Restrictions: No      Mobility  Bed Mobility Overal bed mobility: Needs Assistance Bed Mobility: Supine to Sit     Supine to sit: Min assist;HOB elevated     General bed mobility comments: pt requires min assist for Rt LE management to turn/scoot EOB, pt using bed rails, cues required for sequencing  Transfers Overall transfer level: Needs assistance Equipment used: Rolling walker (2 wheeled) Transfers: Sit to/from Stand Sit to Stand: Min assist         General transfer comment: cues for safe hand placement and min assist to initiate power up from EOB  Ambulation/Gait Ambulation/Gait assistance: Min assist;Min guard Gait Distance (Feet): 100  Feet Assistive device: Rolling walker (2 wheeled) Gait Pattern/deviations: Step-through pattern;Decreased step length - right;Decreased stride length;Decreased stance time - right;Decreased step length - left Gait velocity: slightly decreased   General Gait Details: patient required cues at start for safe hand placement with RW and to maintain safe proximety to walker; no overt LOB noted  Stairs            Wheelchair Mobility    Modified Rankin (Stroke Patients Only)       Balance Overall balance assessment: Needs assistance Sitting-balance support: Feet supported;No upper extremity supported Sitting balance-Leahy Scale: Good     Standing balance support: During functional activity;Bilateral upper extremity supported Standing balance-Leahy Scale: Poor Standing balance comment: pt requires UE support for dynamic and static standing          Pertinent Vitals/Pain Pain Assessment: Faces Faces Pain Scale: Hurts a little bit Pain Location: Rt hip along incision Pain Descriptors / Indicators: Discomfort;Burning Pain Intervention(s): Limited activity within patient's tolerance;Monitored during session;Ice applied    Home Living Family/patient expects to be discharged to:: Private residence Living Arrangements: Spouse/significant other Available Help at Discharge: Family Type of Home: House Home Access: Stairs to enter   [employer] of Steps: 5 from sidewalk with railing; pt is planning to drive up the driveway so he can walk up the path to the doorway and avoid the stairs, will have the threshhold at the door Home Layout: One level Home Equipment: Walker - 2 wheels;Walker - 4 wheels;Cane - single point;Grab bars - tub/shower      Prior Function Level of Independence: Independent with  assistive device(s)         Comments: pt was mobilizing with SPC prior to surgery due to hip pain     Hand Dominance        Extremity/Trunk Assessment   Upper  Extremity Assessment Upper Extremity Assessment: Overall WFL for tasks assessed    Lower Extremity Assessment Lower Extremity Assessment: Generalized weakness;RLE deficits/detail RLE Deficits / Details: pt able to perform heel slide and good quad contraction RLE: Unable to fully assess due to pain    Cervical / Trunk Assessment Cervical / Trunk Assessment: Normal  Communication   Communication: No difficulties  Cognition Arousal/Alertness: Awake/alert Behavior During Therapy: WFL for tasks assessed/performed Overall Cognitive Status: Within Functional Limits for tasks assessed       General Comments      Exercises Total Joint Exercises Ankle Circles/Pumps: AROM;Seated;10 reps;Both Quad Sets: AROM;5 reps;Supine;Seated;Right(2 sets, (1x5 supine, 1x5 seated)) Heel Slides: AROM;5 reps;Seated;Right   Assessment/Plan    PT Assessment Patient needs continued PT services  PT Problem List Decreased balance;Decreased strength;Decreased range of motion;Decreased mobility;Decreased activity tolerance;Decreased knowledge of use of DME       PT Treatment Interventions DME instruction;Functional mobility training;Balance training;Patient/family education;Gait training;Therapeutic activities;Modalities;Therapeutic exercise;Stair training    PT Goals (Current goals can be found in the Care Plan section)  Acute Rehab PT Goals Patient Stated Goal: to return home PT Goal Formulation: With patient Time For Goal Achievement: 05/20/19 Potential to Achieve Goals: Good    Frequency 7X/week    AM-PAC PT "6 Clicks" Mobility  Outcome Measure Help needed turning from your back to your side while in a flat bed without using bedrails?: A Little Help needed moving from lying on your back to sitting on the side of a flat bed without using bedrails?: A Little Help needed moving to and from a bed to a chair (including a wheelchair)?: A Little Help needed standing up from a chair using your arms  (e.g., wheelchair or bedside chair)?: A Little Help needed to walk in hospital room?: A Little Help needed climbing 3-5 steps with a railing? : A Little 6 Click Score: 18    End of Session Equipment Utilized During Treatment: Gait belt Activity Tolerance: Patient tolerated treatment well Patient left: with call bell/phone within reach;in chair;with chair alarm set;with family/visitor present;with SCD's reapplied Nurse Communication: Mobility status PT Visit Diagnosis: Unsteadiness on feet (R26.81);Muscle weakness (generalized) (M62.81);Difficulty in walking, not elsewhere classified (R26.2)    Time: 1630-1701 PT Time Calculation (min) (ACUTE ONLY): 31 min   Charges:   PT Evaluation $PT Eval Low Complexity: 1 Low PT Treatments $Therapeutic Exercise: 8-22 mins        Kipp Brood, PT, DPT, [facility] Physical Therapist with [facility]  05/13/2019 5:15 PM

## 2019-05-13 NOTE — Anesthesia Procedure Notes (Signed)
Spinal  Patient location during procedure: OR Start time: 05/13/2019 10:48 AM Staffing Resident/CRNA: [place] Canisha Issac C, CRNA Performed: resident/CRNA  Preanesthetic Checklist Completed: patient identified, site marked, surgical consent, pre-op evaluation, timeout performed, IV checked, risks and benefits discussed and monitors and equipment checked Spinal Block Patient position: sitting Prep: DuraPrep Patient monitoring: heart rate, cardiac monitor, continuous pulse ox and blood pressure Approach: midline Location: L3-4 Injection technique: single-shot Needle Needle type: Pencan  Needle gauge: 24 G Needle length: 9 cm Assessment Sensory level: T4 Additional Notes IV functioning, monitors applied to pt. Expiration date of kit checked and confirmed to be in date. Sterile prep and drape, hand hygiene and sterile gloved used. Pt was positioned and spine was prepped in sterile fashion. Skin was anesthetized with lidocaine. Free flow of clear CSF obtained prior to injecting local anesthetic into CSF x 1 attempt. Spinal needle aspirated freely following injection. Needle was carefully withdrawn, and pt tolerated procedure well. Loss of motor and sensory on exam post injection.

## 2019-05-13 NOTE — Anesthesia Preprocedure Evaluation (Signed)
Anesthesia Evaluation  Patient identified by MRN, date of birth, ID band Patient awake    Reviewed: Allergy & Precautions, H&P , NPO status , Patient's Chart, lab work & pertinent test results, reviewed documented beta blocker date and time   Airway Mallampati: II  TM Distance: >3 FB Neck ROM: full    Dental  (+) Edentulous Lower, Poor Dentition, Implants, Dental Advisory Given Upper front teeth are implants but they are worn down to nubs.:   Pulmonary neg pulmonary ROS, former smoker,    Pulmonary exam normal breath sounds clear to auscultation       Cardiovascular Exercise Tolerance: Good hypertension, Pt. on medications and Pt. on home beta blockers Normal cardiovascular exam Rhythm:regular Rate:Normal     Neuro/Psych negative neurological ROS  negative psych ROS   GI/Hepatic negative GI ROS, Neg liver ROS,   Endo/Other  negative endocrine ROS  Renal/GU negative Renal ROS  negative genitourinary   Musculoskeletal   Abdominal   Peds  Hematology negative hematology ROS (+)   Anesthesia Other Findings   Reproductive/Obstetrics negative OB ROS                             Anesthesia Physical  Anesthesia Plan  ASA: II  Anesthesia Plan: Spinal   Post-op Pain Management:    Induction:   PONV Risk Score and Plan: 1 and Treatment may vary due to age or medical condition  Airway Management Planned: Simple Face Mask  Additional Equipment:   Intra-op Plan:   Post-operative Plan:   Informed Consent: I have reviewed the patients History and Physical, chart, labs and discussed the procedure including the risks, benefits and alternatives for the proposed anesthesia with the patient or authorized representative who has indicated his/her understanding and acceptance.     Dental Advisory Given and Dental advisory given  Plan Discussed with: CRNA and Surgeon  Anesthesia Plan Comments:          Anesthesia Quick Evaluation

## 2019-05-13 NOTE — Anesthesia Postprocedure Evaluation (Signed)
Anesthesia Post Note  Patient: Richard Curtis  Procedure(s) Performed: TOTAL HIP ARTHROPLASTY ANTERIOR APPROACH (Right Hip)     Patient location during evaluation: PACU Anesthesia Type: Spinal Level of consciousness: awake and alert Pain management: pain level controlled Vital Signs Assessment: post-procedure vital signs reviewed and stable Respiratory status: spontaneous breathing and respiratory function stable Cardiovascular status: blood pressure returned to baseline and stable Postop Assessment: no headache, no backache, spinal receding and no apparent nausea or vomiting Anesthetic complications: no    Last Vitals:  Vitals:   05/13/19 1418 05/13/19 1517  BP: (!) 156/83 (!) 151/91  Pulse: 63 68  Resp: 16 16  Temp: 36.6 C 36.9 C  SpO2: 100% 97%    Last Pain:  Vitals:   05/13/19 1517  TempSrc: Oral  PainSc:                  Montez Hageman

## 2019-05-13 NOTE — Interval H&P Note (Signed)
History and Physical Interval Note:  05/13/2019 9:46 AM  Richard Curtis  has presented today for surgery, with the diagnosis of Right hip osteoarthritis.  The various methods of treatment have been discussed with the patient and family. After consideration of risks, benefits and other options for treatment, the patient has consented to  Procedure(s) with comments: TOTAL HIP ARTHROPLASTY ANTERIOR APPROACH (Right) - 70 mins as a surgical intervention.  The patient's history has been reviewed, patient examined, no change in status, stable for surgery.  I have reviewed the patient's chart and labs.  Questions were answered to the patient's satisfaction.     [name]

## 2019-05-13 NOTE — Op Note (Signed)
NAME:  Richard Curtis                ACCOUNT NO.: [id number]      MEDICAL RECORD NO.: [id number]      FACILITY:  [facility]      PHYSICIAN:  [name]  DATE OF BIRTH:  09-30-1945     DATE OF PROCEDURE:  05/13/2019                                 OPERATIVE REPORT         PREOPERATIVE DIAGNOSIS: Right  hip osteoarthritis.      POSTOPERATIVE DIAGNOSIS:  Right hip osteoarthritis.      PROCEDURE:  Right total hip replacement through an anterior approach   utilizing DePuy THR system, component size 29mm pinnacle cup, a size 36+4 neutral   Altrex liner, a size 8 Hi Actis stem with a 36+1.5 Articuleze metal head ball.      SURGEON:  Pietro Cassis. Alvan Dame, M.D.      ASSISTANT:  Griffith Citron, PA-C     ANESTHESIA:  Spinal.      SPECIMENS:  None.      COMPLICATIONS:  None.      BLOOD LOSS:  500 cc     DRAINS:  None.      INDICATION OF THE PROCEDURE:  Richard Curtis is a 73 y.o. male who had   presented to office for evaluation of right hip pain.  Radiographs revealed   progressive degenerative changes with bone-on-bone   articulation of the  hip joint, including subchondral cystic changes and osteophytes.  The patient had painful limited range of   motion significantly affecting their overall quality of life and function.  The patient was failing to    respond to conservative measures including medications and/or injections and activity modification and at this point was ready   to proceed with more definitive measures.  Consent was obtained for   benefit of pain relief.  Specific risks of infection, DVT, component   failure, dislocation, neurovascular injury, and need for revision surgery were reviewed in the office as well discussion of   the anterior versus posterior approach were reviewed.     PROCEDURE IN DETAIL:  The patient was brought to operative theater.   Once adequate anesthesia, preoperative antibiotics, 2 gm of Ancef, 1 gm of Tranexamic Acid,  and 10 mg of Decadron were administered, the patient was positioned supine on the [employer] table.  Once the patient was safely positioned with adequate padding of boney prominences we predraped out the hip, and used fluoroscopy to confirm orientation of the pelvis.      The right hip was then prepped and draped from proximal iliac crest to   mid thigh with a shower curtain technique.      Time-out was performed identifying the patient, planned procedure, and the appropriate extremity.     An incision was then made 2 cm lateral to the   anterior superior iliac spine extending over the orientation of the   tensor fascia lata muscle and sharp dissection was carried down to the   fascia of the muscle.      The fascia was then incised.  The muscle belly was identified and swept   laterally and retractor placed along the superior neck.  Following   cauterization of the circumflex vessels and removing some pericapsular  fat, a second cobra retractor was placed on the inferior neck.  A T-capsulotomy was made along the line of the   superior neck to the trochanteric fossa, then extended proximally and   distally.  Tag sutures were placed and the retractors were then placed   intracapsular.  We then identified the trochanteric fossa and   orientation of my neck cut and then made a neck osteotomy with the femur on traction.  The femoral   head was removed without difficulty or complication.  Traction was let   off and retractors were placed posterior and anterior around the   acetabulum.      The labrum and foveal tissue were debrided.  I began reaming with a 47 mm   reamer and reamed up to 53 mm reamer with good bony bed preparation and a 54 mm  cup was chosen.  The final 54 mm Pinnacle cup was then impacted under fluoroscopy to confirm the depth of penetration and orientation with respect to   Abduction and forward flexion.  A screw was placed into the ilium followed by the hole eliminator.  The  final   36+4 neutral Altrex liner was impacted with good visualized rim fit.  The cup was positioned anatomically within the acetabular portion of the pelvis.      At this point, the femur was rolled to 100 degrees.  Further capsule was   released off the inferior aspect of the femoral neck.  I then   released the superior capsule proximally.  With the leg in a neutral position the hook was placed laterally   along the femur under the vastus lateralis origin and elevated manually and then held in position using the hook attachment on the bed.  The leg was then extended and adducted with the leg rolled to 100   degrees of external rotation.  Retractors were placed along the medial calcar and posteriorly over the greater trochanter.  Once the proximal femur was fully   exposed, I used a box osteotome to set orientation.  I then began   broaching with the starting chili pepper broach and passed this by hand and then broached up to 8.  With the 8 broach in place I chose a high offset neck and did several trial reductions.  The offset was appropriate, leg lengths   appeared to be equal best matched with the +1.5 head ball trial confirmed radiographically.   Given these findings, I went ahead and dislocated the hip, repositioned all   retractors and positioned the right hip in the extended and abducted position.  The final 8 Hi Actis stem was   chosen and it was impacted down to the level of neck cut.  Based on this   and the trial reductions, a final 36+1.5 Articuleze metal ball was chosen and   impacted onto a clean and dry trunnion, and the hip was reduced.  The   hip had been irrigated throughout the case again at this point.  I did   reapproximate the superior capsular leaflet to the anterior leaflet   using #1 Vicryl.  The fascia of the   tensor fascia lata muscle was then reapproximated using #1 Vicryl and #0 Stratafix sutures.  The   remaining wound was closed with 2-0 Vicryl and running 4-0  Monocryl.   The hip was cleaned, dried, and dressed sterilely using Dermabond and   Aquacel dressing.  The patient was then brought   to recovery room in  stable condition tolerating the procedure well.    Lanney Gins, PA-C was present for the entirety of the case involved from   preoperative positioning, perioperative retractor management, general   facilitation of the case, as well as primary wound closure as assistant.            Madlyn Frankel Charlann Boxer, M.D.        05/13/2019 11:04 AM

## 2019-05-13 NOTE — Discharge Instructions (Signed)

## 2019-05-14 ENCOUNTER — Encounter (HOSPITAL_COMMUNITY): Payer: Self-pay | Admitting: Orthopedic Surgery

## 2019-05-14 DIAGNOSIS — E669 Obesity, unspecified: Secondary | ICD-10-CM | POA: Diagnosis present

## 2019-05-14 DIAGNOSIS — M1611 Unilateral primary osteoarthritis, right hip: Secondary | ICD-10-CM | POA: Diagnosis not present

## 2019-05-14 LAB — CBC
HCT: 35 % — ABNORMAL LOW (ref 39.0–52.0)
Hemoglobin: 11.3 g/dL — ABNORMAL LOW (ref 13.0–17.0)
MCH: 31 pg (ref 26.0–34.0)
MCHC: 32.3 g/dL (ref 30.0–36.0)
MCV: 95.9 fL (ref 80.0–100.0)
Platelets: 247 10*3/uL (ref 150–400)
RBC: 3.65 MIL/uL — ABNORMAL LOW (ref 4.22–5.81)
RDW: 12.6 % (ref 11.5–15.5)
WBC: 13 10*3/uL — ABNORMAL HIGH (ref 4.0–10.5)
nRBC: 0 % (ref 0.0–0.2)

## 2019-05-14 LAB — BASIC METABOLIC PANEL
Anion gap: 9 (ref 5–15)
BUN: 15 mg/dL (ref 8–23)
CO2: 22 mmol/L (ref 22–32)
Calcium: 8.4 mg/dL — ABNORMAL LOW (ref 8.9–10.3)
Chloride: 98 mmol/L (ref 98–111)
Creatinine, Ser: 0.89 mg/dL (ref 0.61–1.24)
GFR calc Af Amer: >60 mL/min (ref >60)
GFR calc non Af Amer: >60 mL/min (ref >60)
Glucose, Bld: 149 mg/dL — ABNORMAL HIGH (ref 70–99)
Potassium: 4.4 mmol/L (ref 3.5–5.1)
Sodium: 129 mmol/L — ABNORMAL LOW (ref 135–145)

## 2019-05-14 MED ORDER — ASPIRIN 81 MG PO CHEW: 81.0000 mg | CHEWABLE_TABLET | Freq: Two times a day (BID) | ORAL | 0 refills | Status: AC

## 2019-05-14 MED ORDER — METHOCARBAMOL 500 MG PO TABS: 500.0000 mg | ORAL_TABLET | Freq: Four times a day (QID) | ORAL | 0 refills | Status: AC | PRN

## 2019-05-14 MED ORDER — HYDROCODONE-ACETAMINOPHEN 5-325 MG PO TABS: 1.0000 | ORAL_TABLET | Freq: Four times a day (QID) | ORAL | 0 refills | Status: AC | PRN

## 2019-05-14 MED ORDER — POLYETHYLENE GLYCOL 3350 17 G PO PACK: 17.0000 g | PACK | Freq: Two times a day (BID) | ORAL | 0 refills | Status: AC

## 2019-05-14 MED ORDER — DOCUSATE SODIUM 100 MG PO CAPS: 100.0000 mg | ORAL_CAPSULE | Freq: Two times a day (BID) | ORAL | 0 refills | Status: AC

## 2019-05-14 MED ORDER — FERROUS SULFATE 325 (65 FE) MG PO TABS: 325.0000 mg | ORAL_TABLET | Freq: Three times a day (TID) | ORAL | 0 refills | Status: AC

## 2019-05-14 NOTE — Plan of Care (Signed)
Pt ready for D\C

## 2019-05-14 NOTE — Plan of Care (Signed)
  Problem: Education: Goal: Knowledge of the prescribed therapeutic regimen will improve Outcome: Progressing   Problem: Activity: Goal: Ability to tolerate increased activity will improve Outcome: Progressing   Problem: Pain Management: Goal: Pain level will decrease with appropriate interventions Outcome: Progressing   Problem: Education: Goal: Knowledge of General Education information will improve Description: Including pain rating scale, medication(s)/side effects and non-pharmacologic comfort measures Outcome: Progressing   Problem: Activity: Goal: Risk for activity intolerance will decrease Outcome: Progressing   Problem: Pain Managment: Goal: General experience of comfort will improve Outcome: Progressing   

## 2019-05-14 NOTE — Progress Notes (Signed)
     Subjective: 1 Day Post-Op Procedure(s) (LRB): TOTAL HIP ARTHROPLASTY ANTERIOR APPROACH (Right)   Seen by Dr. Alvan Dame.  Patient reports pain as mild, pain controlled. No events throughout the night. Feels that he is doing well.  Ready to be discharged home, if he does well with PT.    Objective:   VITALS:   Vitals:   05/14/19 0126 05/14/19 0450  BP: 117/80 131/85  Pulse: 62 61  Resp: 18 18  Temp: 97.9 F (36.6 C) 98 F (36.7 C)  SpO2: 96% 98%    Dorsiflexion/Plantar flexion intact Incision: dressing C/D/I No cellulitis present Compartment soft  LABS Recent Labs    05/14/19 0243  HGB 11.3*  HCT 35.0*  WBC 13.0*  PLT 247    Recent Labs    05/14/19 0243  NA 129*  K 4.4  BUN 15  CREATININE 0.89  GLUCOSE 149*     Assessment/Plan: 1 Day Post-Op Procedure(s) (LRB): TOTAL HIP ARTHROPLASTY ANTERIOR APPROACH (Right) Foley cath d/c'ed Advance diet Up with therapy D/C IV fluids Discharge home Follow up in 2 weeks at [facility] Follow up with OLIN,Tyqwan Pink D in 2 weeks.  Contact information:  [facility] [address], [place] [phone]    Obese (BMI 30-39.9) Estimated body mass index is 34.71 kg/m as calculated from the following:   Height as of this encounter: 5\' 8"  (1.727 m).   Weight as of this encounter: 103.6 kg. Patient also counseled that weight may inhibit the healing process Patient counseled that losing weight will help with future health issues      [place] P[place]. Grant Swager   PAC  05/14/2019, 7:39 AM

## 2019-05-14 NOTE — Progress Notes (Signed)
Physical Therapy Treatment Patient Details Name: Richard Curtis MRN: [id number] DOB: 1946/05/05 Today's Date: 05/14/2019    History of Present Illness Pt is 73 y.o. male s/p Rt THA ant approach on 05/13/19 with PMH significant for HTN, arthritis, and Lt THA in 2015.    PT Comments    POD # 1 pm session Assisted with performing all standing TE's following HEP.  Instructed on proper tech, use of ICE and activity level at home.  Assisted with car transfer with instruction on safety and proper tech.  Addressed all mobility questions, discussed appropriate activity, educated on use of ICE.  Pt ready for D/C to home.    Follow Up Recommendations  Follow surgeon's recommendation for DC plan and follow-up therapies     Equipment Recommendations  None recommended by PT    Recommendations for Other Services       Precautions / Restrictions Precautions Precautions: Fall Restrictions Weight Bearing Restrictions: No    Mobility  Bed Mobility               General bed mobility comments: OOB in recliner  Transfers Overall transfer level: Needs assistance Equipment used: Rolling walker (2 wheeled) Transfers: Sit to/from Stand Sit to Stand: Supervision;Min guard         General transfer comment: 25% VC's on safety with turns and to use walker throughout   Also assisted with a car transfer  Ambulation/Gait Ambulation/Gait assistance: Supervision;Min guard Gait Distance (Feet): 18 Feet Assistive device: Rolling walker (2 wheeled) Gait Pattern/deviations: Step-through pattern;Decreased step length - right;Decreased stride length;Decreased stance time - right;Decreased step length - left Gait velocity: slightly decreased   General Gait Details: 25% VC's on proper walker to self distance and safety with turns   Stairs Stairs: (no stairs to enter)           Wheelchair Mobility    Modified Rankin (Stroke Patients Only)       Balance                                            Cognition Arousal/Alertness: Awake/alert Behavior During Therapy: WFL for tasks assessed/performed Overall Cognitive Status: Within Functional Limits for tasks assessed                                        Exercises   10 reps all standing TE's    General Comments        Pertinent Vitals/Pain Pain Assessment: 0-10 Pain Score: 2  Pain Location: R hip Pain Descriptors / Indicators: Discomfort;Tender;Tightness Pain Intervention(s): Monitored during session;Repositioned;Ice applied    Home Living                      Prior Function            PT Goals (current goals can now be found in the care plan section) Progress towards PT goals: Progressing toward goals    Frequency    7X/week      PT Plan Current plan remains appropriate    Co-evaluation              AM-PAC PT "6 Clicks" Mobility   Outcome Measure  Help needed turning from your back to your side while in a flat bed without using  bedrails?: A Little Help needed moving from lying on your back to sitting on the side of a flat bed without using bedrails?: A Little Help needed moving to and from a bed to a chair (including a wheelchair)?: A Little Help needed standing up from a chair using your arms (e.g., wheelchair or bedside chair)?: A Little Help needed to walk in hospital room?: A Little Help needed climbing 3-5 steps with a railing? : A Little 6 Click Score: 18    End of Session Equipment Utilized During Treatment: Gait belt Activity Tolerance: Patient tolerated treatment well Patient left: with call bell/phone within reach;in chair;with chair alarm set;with family/visitor present;with SCD's reapplied Nurse Communication: Mobility status PT Visit Diagnosis: Unsteadiness on feet (R26.81);Muscle weakness (generalized) (M62.81);Difficulty in walking, not elsewhere classified (R26.2)     Time: 1761-6073 PT Time Calculation (min) (ACUTE  ONLY): 23 min  Charges:  $Gait Training: 8-22 mins $Therapeutic Activity: 8-22 mins                     Rica Koyanagi  PTA Acute  Rehabilitation Services Pager      [phone] Office      [phone]

## 2019-05-14 NOTE — Progress Notes (Signed)
Physical Therapy Treatment Patient Details Name: Richard Curtis MRN: [id number] DOB: [birth year] Today's Date: 05/14/2019    History of Present Illness Pt is 73 y.o. male s/p Rt THA ant approach on 05/13/19 with PMH significant for HTN, arthritis, and Lt THA in 2015.    PT Comments    POD # 1 am session Assisted with amb in hallway Then returned to room to perform some TE's following HEP handout.  Instructed on proper tech, freq as well as use of ICE.     Follow Up Recommendations  Follow surgeon's recommendation for DC plan and follow-up therapies     Equipment Recommendations  None recommended by PT    Recommendations for Other Services       Precautions / Restrictions Precautions Precautions: Fall Restrictions Weight Bearing Restrictions: No    Mobility  Bed Mobility               General bed mobility comments: OOB in recliner  Transfers Overall transfer level: Needs assistance Equipment used: Rolling walker (2 wheeled) Transfers: Sit to/from Stand Sit to Stand: Supervision;Min guard         General transfer comment: 25% VC's on safety with turns and to use walker throughout  Ambulation/Gait Ambulation/Gait assistance: Supervision;Min guard Gait Distance (Feet): 85 Feet Assistive device: Rolling walker (2 wheeled) Gait Pattern/deviations: Step-through pattern;Decreased step length - right;Decreased stride length;Decreased stance time - right;Decreased step length - left Gait velocity: slightly decreased   General Gait Details: 25% VC's on proper walker to self distance and safety with turns   Stairs Stairs: (no stairs to enter)           Wheelchair Mobility    Modified Rankin (Stroke Patients Only)       Balance                                            Cognition Arousal/Alertness: Awake/alert Behavior During Therapy: WFL for tasks assessed/performed Overall Cognitive Status: Within Functional Limits for tasks  assessed                                        Exercises   Total Hip Replacement TE's 10 reps ankle pumps 10 reps knee presses 10 reps heel slides 10 reps SAQ's 10 reps ABD Followed by ICE     General Comments        Pertinent Vitals/Pain Pain Assessment: 0-10 Pain Score: 2  Pain Location: R hip Pain Descriptors / Indicators: Discomfort;Tender;Tightness Pain Intervention(s): Monitored during session;Repositioned;Ice applied    Home Living                      Prior Function            PT Goals (current goals can now be found in the care plan section) Progress towards PT goals: Progressing toward goals    Frequency    7X/week      PT Plan Current plan remains appropriate    Co-evaluation              AM-PAC PT "6 Clicks" Mobility   Outcome Measure  Help needed turning from your back to your side while in a flat bed without using bedrails?: A Little Help needed moving from lying on your  back to sitting on the side of a flat bed without using bedrails?: A Little Help needed moving to and from a bed to a chair (including a wheelchair)?: A Little Help needed standing up from a chair using your arms (e.g., wheelchair or bedside chair)?: A Little Help needed to walk in hospital room?: A Little Help needed climbing 3-5 steps with a railing? : A Little 6 Click Score: 18    End of Session Equipment Utilized During Treatment: Gait belt Activity Tolerance: Patient tolerated treatment well Patient left: with call bell/phone within reach;in chair;with chair alarm set;with family/visitor present;with SCD's reapplied Nurse Communication: Mobility status PT Visit Diagnosis: Unsteadiness on feet (R26.81);Muscle weakness (generalized) (M62.81);Difficulty in walking, not elsewhere classified (R26.2)     Time: 0932-3557 PT Time Calculation (min) (ACUTE ONLY): 24 min  Charges:  $Gait Training: 8-22 mins $Therapeutic Exercise: 8-22  mins                     Felecia Shelling  PTA Acute  Rehabilitation Services Pager      [phone] Office      [phone]

## 2019-05-19 NOTE — Discharge Summary (Signed)
Physician Discharge Summary  Patient ID: Richard Curtis MRN: [id number] DOB/AGE: 08-27-1945 73 y.o.  Admit date: 05/13/2019 Discharge date: 05/14/2019   Procedures:  Procedure(s) (LRB): TOTAL HIP ARTHROPLASTY ANTERIOR APPROACH (Right)  Attending Physician:  Dr. Durene Romans   Admission Diagnoses:   Right hip primary OA / pain  Discharge Diagnoses:  Principal Problem:   S/P right THA, AA Active Problems:   Obese  Past Medical History:  Diagnosis Date  . Arthritis   . Hypertension     HPI:    Richard Curtis, 73 y.o. male, has a history of pain and functional disability in the right hip(s) due to arthritis and patient has failed non-surgical conservative treatments for greater than 12 weeks to include NSAID's and/or analgesics, use of assistive devices and activity modification.  Onset of symptoms was gradual starting <1 year ago with rapidlly worsening course since that time.The patient noted prior procedures of the hip to include arthroplasty on the left hip 5 years ago.  Patient currently rates pain in the right hip at 10 out of 10 with activity. Patient has night pain, worsening of pain with activity and weight bearing, trendelenberg gait, pain that interfers with activities of daily living and pain with passive range of motion. Patient has evidence of periarticular osteophytes and joint space narrowing by imaging studies. This condition presents safety issues increasing the risk of falls.  There is no current active infection.  Risks, benefits and expectations were discussed with the patient.  Risks including but not limited to the risk of anesthesia, blood clots, nerve damage, blood vessel damage, failure of the prosthesis, infection and up to and including death.  Patient understand the risks, benefits and expectations and wishes to proceed with surgery.   PCP: Lorelei Pont, DO   Discharged Condition: good  Hospital Course:  Patient underwent the above stated procedure on  05/13/2019. Patient tolerated the procedure well and brought to the recovery room in good condition and subsequently to the floor.  POD #1 BP: 131/85 ; Pulse: 61 ; Temp: 98 F (36.7 C) ; Resp: 18 Patient reports pain as mild, pain controlled. No events throughout the night. Feels that he is doing well.  Ready to be discharged home. Dorsiflexion/plantar flexion intact, incision: dressing C/D/I, no cellulitis present and compartment soft.   LABS  Basename    HGB     11.3  HCT     35.0    Discharge Exam: General appearance: alert, cooperative and no distress Extremities: Homans sign is negative, no sign of DVT, no edema, redness or tenderness in the calves or thighs and no ulcers, gangrene or trophic changes  Disposition:  Home with follow up in 2 weeks   Follow-up Information    Durene Romans, MD. Schedule an appointment as soon as possible for a visit in 2 weeks.   Specialty: Orthopedic Surgery Contact information: [address] [place] [place] [address] [phone]           Discharge Instructions    Call MD / Call 911   Complete by: As directed    If you experience chest pain or shortness of breath, CALL 911 and be transported to the hospital emergency room.  If you develope a fever above 101 F, pus (white drainage) or increased drainage or redness at the wound, or calf pain, call your surgeon's office.   Change dressing   Complete by: As directed    Maintain surgical dressing until follow up in the  clinic. If the edges start to pull up, may reinforce with tape. If the dressing is no longer working, may remove and cover with gauze and tape, but must keep the area dry and clean.  Call with any questions or concerns.   Constipation Prevention   Complete by: As directed    Drink plenty of fluids.  Prune juice may be helpful.  You may use a stool softener, such as Colace (over the counter) 100 mg twice a day.  Use MiraLax (over the counter) for constipation as  needed.   Diet - low sodium heart healthy   Complete by: As directed    Discharge instructions   Complete by: As directed    Maintain surgical dressing until follow up in the clinic. If the edges start to pull up, may reinforce with tape. If the dressing is no longer working, may remove and cover with gauze and tape, but must keep the area dry and clean.  [facility] Call with any questions or concerns.   Increase activity slowly as tolerated   Complete by: As directed    Weight bearing as tolerated with assist device (walker, cane, etc) as directed, use it as long as suggested by your surgeon or therapist, typically at least 4-6 weeks.   TED hose   Complete by: As directed    Use stockings (TED hose) for 2 weeks on both leg(s).  You may remove them at night for sleeping.      Allergies as of 05/14/2019   No Known Allergies     Medication List    STOP taking these medications   acetaminophen 500 MG tablet Commonly known as: TYLENOL     TAKE these medications   amLODipine 10 MG tablet Commonly known as: NORVASC Take 10 mg by mouth daily.   aspirin 81 MG chewable tablet Commonly known as: Aspirin Childrens Chew 1 tablet (81 mg total) by mouth 2 (two) times daily. Take for 4 weeks, then resume regular dose.   atorvastatin 80 MG tablet Commonly known as: LIPITOR Take 80 mg by mouth at bedtime.   benazepril 20 MG tablet Commonly known as: LOTENSIN Take 20 mg by mouth daily.   CINNAMON PLUS CHROMIUM PO Take 1,000 mg by mouth 3 (three) times a week.   docusate sodium 100 MG capsule Commonly known as: Colace Take 1 capsule (100 mg total) by mouth 2 (two) times daily.   ferrous sulfate 325 (65 FE) MG tablet Commonly known as: FerrouSul Take 1 tablet (325 mg total) by mouth 3 (three) times daily with meals for 14 days. What changed: when to take this   Garlic 1000 MG Caps Take 1,000 mg by mouth daily.   HYDROcodone-acetaminophen  5-325 MG tablet Commonly known as: Norco Take 1-2 tablets by mouth every 6 (six) hours as needed for moderate pain or severe pain.   Magnesium 250 MG Tabs Take 250 mg by mouth daily.   MEGARED OMEGA-3 KRILL OIL PO Take 1 capsule by mouth daily.   methocarbamol 500 MG tablet Commonly known as: Robaxin Take 1 tablet (500 mg total) by mouth every 6 (six) hours as needed for muscle spasms.   metoprolol succinate 50 MG 24 hr tablet Commonly known as: TOPROL-XL Take 25 mg by mouth every evening. Take with or immediately following a meal.   multivitamin with minerals Tabs tablet Take 1 tablet by mouth every morning.   polyethylene glycol 17 g packet Commonly known as: MIRALAX /  GLYCOLAX Take 17 g by mouth 2 (two) times daily.   Potassium 99 MG Tabs Take 99 mg by mouth daily.   Turmeric 500 MG Caps Take 500 mg by mouth daily.   vitamin B-12 250 MCG tablet Commonly known as: CYANOCOBALAMIN Take 250 mcg by mouth daily.   Vitamin D3 125 MCG (5000 UT) Tabs Take 5,000 Units by mouth daily.   Zinc 50 MG Tabs Take 50 mg by mouth daily.            Discharge Care Instructions  (From admission, onward)         Start     Ordered   05/14/19 0000  Change dressing    Comments: Maintain surgical dressing until follow up in the clinic. If the edges start to pull up, may reinforce with tape. If the dressing is no longer working, may remove and cover with gauze and tape, but must keep the area dry and clean.  Call with any questions or concerns.   05/14/19 0744           Signed: West Pugh. Laynee Lockamy   PA-C  05/19/2019, 8:42 PM

## 2020-08-19 IMAGING — RF DG HIP (WITH PELVIS) OPERATIVE*R*
1 series · 2 of 2 positions shown · non-contrast
Comparison: RADIOGRAPHS DATED 07/28/2014

CLINICAL DATA: Right hip osteoarthritis.

EXAM:
OPERATIVE RIGHT HIP WITH PELVIS; DG C-ARM 1-60 MIN-NO REPORT

[Series 1: run · 2 of 2 slices shown]
[im 1/2]
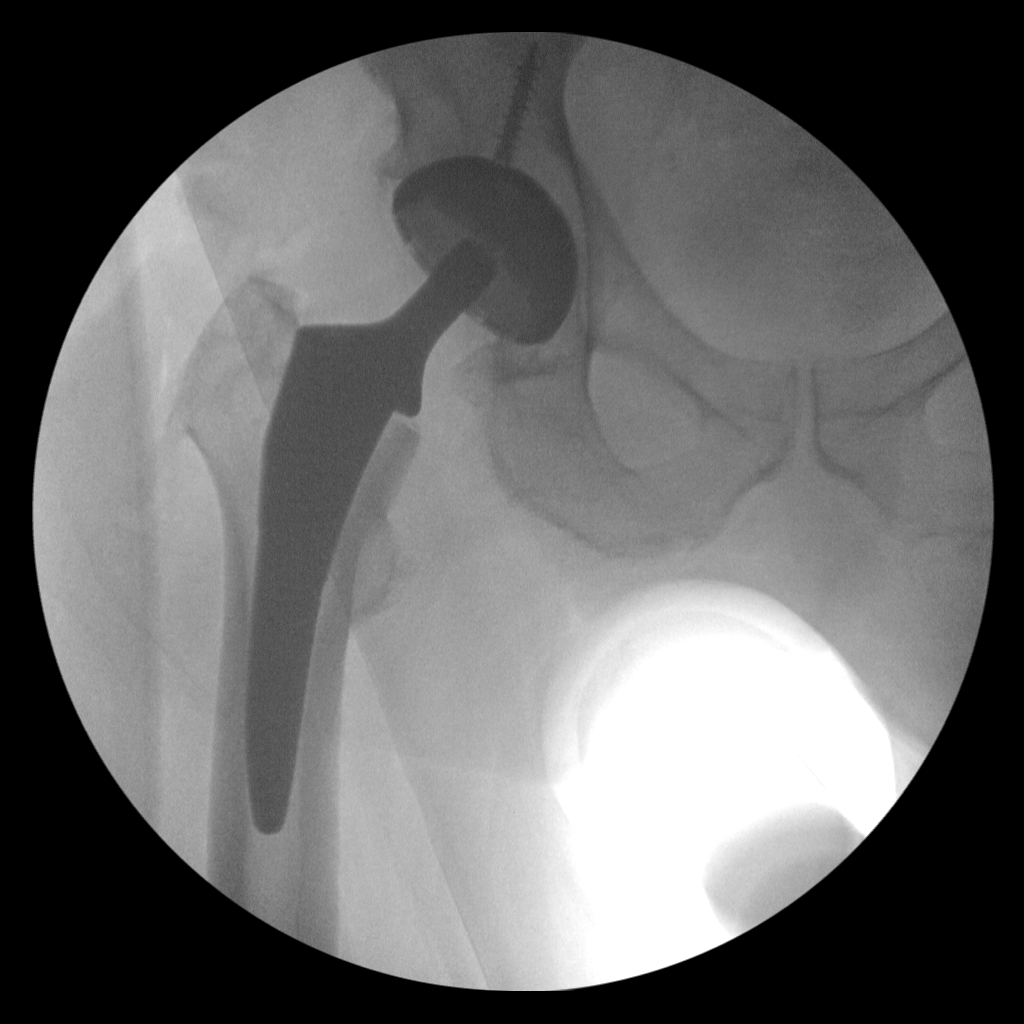
[im 2/2]
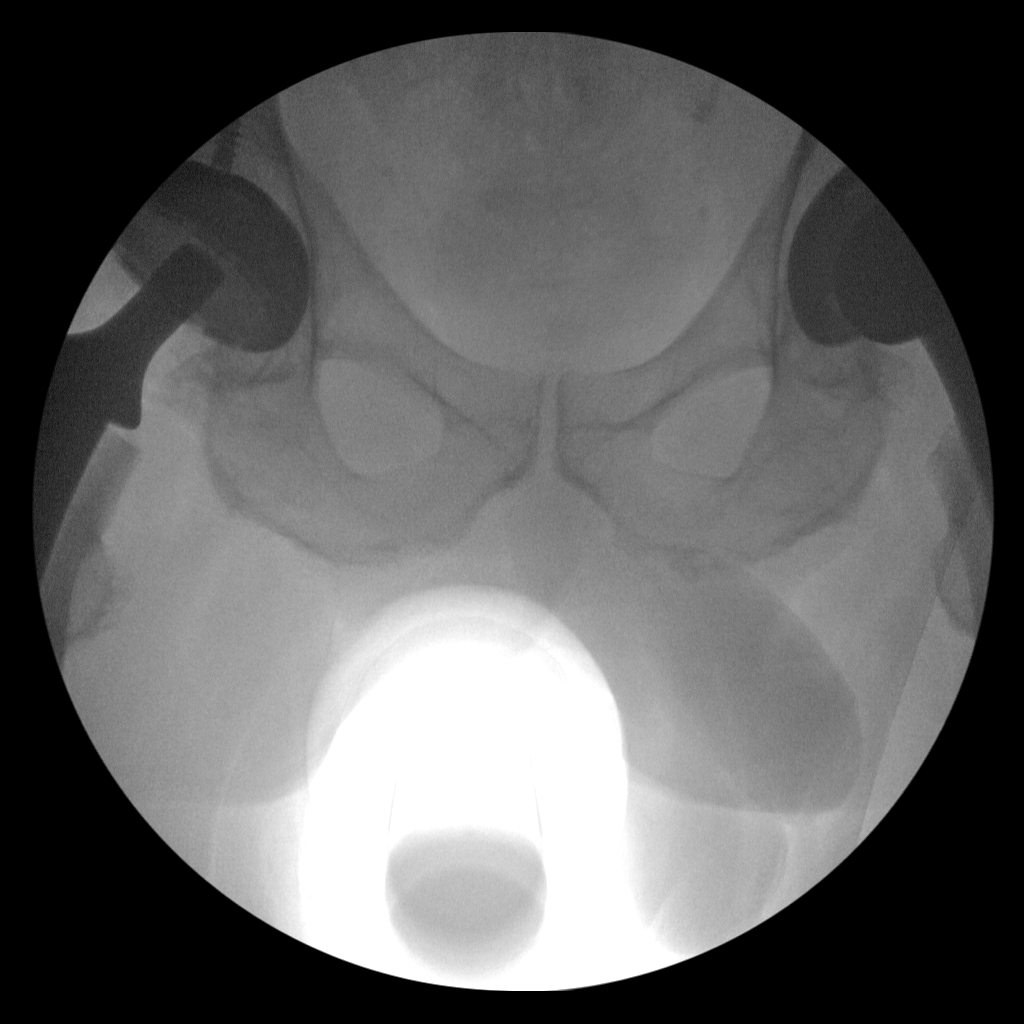

[2 of 2 positions shown; findings below may reference images not displayed]

FINDINGS: Multiple C-arm images demonstrate the patient undergoing insertion
of right total hip prosthesis. Components appear in good position in
the AP projection on the available images.
IMPRESSION: Satisfactory appearance of the osteoarthritis of the right hip.

FLUOROSCOPY TIME:  23 seconds.  3.25 mGy.

C-arm fluoroscopic images were obtained intraoperatively and
submitted for post operative interpretation.

## 2020-08-19 IMAGING — DX DG PORTABLE PELVIS
1 series · 1 of 1 positions shown · non-contrast
Comparison: 07/28/2014

CLINICAL DATA: Post left hip replacement.

EXAM:
PORTABLE PELVIS 1-2 VIEWS

[pelvis ap]
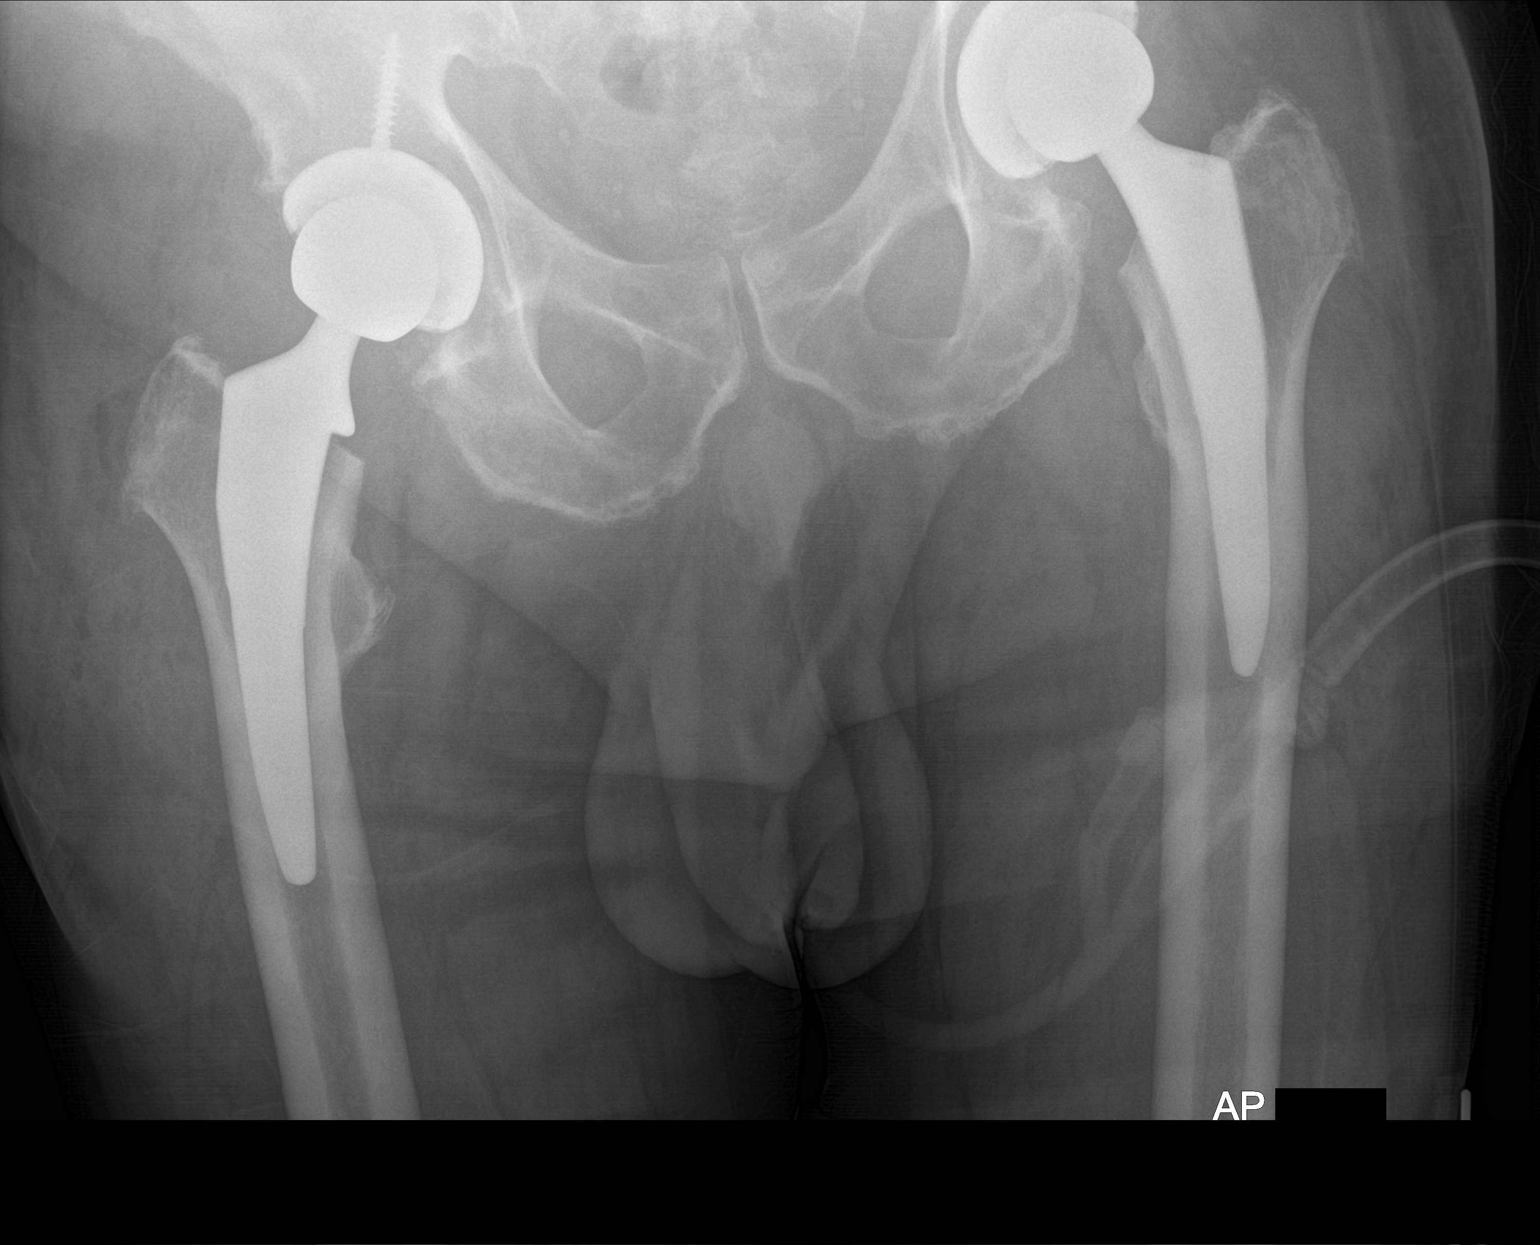

[1 of 1 positions shown; findings below may reference images not displayed]

FINDINGS: Examination demonstrates evidence of patient's recent right total
hip arthroplasty with prosthesis intact and normally located. Left
hip arthroplasty unchanged. Remainder of the exam is unchanged.
IMPRESSION: Expected changes post right total hip arthroplasty.
